# Patient Record
Sex: Male | Born: 1950 | Race: White | Hispanic: No | Marital: Married | Smoking: Former smoker
Health system: Southern US, Community
[De-identification: ages and names within clinical notes are randomized; demographics above are authoritative.]

## PROBLEM LIST (undated history)

## (undated) DIAGNOSIS — D649 Anemia, unspecified: Secondary | ICD-10-CM

## (undated) DIAGNOSIS — K219 Gastro-esophageal reflux disease without esophagitis: Secondary | ICD-10-CM

## (undated) DIAGNOSIS — J189 Pneumonia, unspecified organism: Secondary | ICD-10-CM

## (undated) DIAGNOSIS — E291 Testicular hypofunction: Secondary | ICD-10-CM

## (undated) DIAGNOSIS — E7800 Pure hypercholesterolemia, unspecified: Secondary | ICD-10-CM

## (undated) DIAGNOSIS — A048 Other specified bacterial intestinal infections: Secondary | ICD-10-CM

## (undated) DIAGNOSIS — IMO0002 Reserved for concepts with insufficient information to code with codable children: Secondary | ICD-10-CM

## (undated) DIAGNOSIS — N433 Hydrocele, unspecified: Secondary | ICD-10-CM

## (undated) DIAGNOSIS — R42 Dizziness and giddiness: Secondary | ICD-10-CM

## (undated) DIAGNOSIS — R300 Dysuria: Secondary | ICD-10-CM

## (undated) DIAGNOSIS — I10 Essential (primary) hypertension: Secondary | ICD-10-CM

## (undated) DIAGNOSIS — M1990 Unspecified osteoarthritis, unspecified site: Secondary | ICD-10-CM

## (undated) HISTORY — PX: UPPER GASTROINTESTINAL ENDOSCOPY: SHX188

## (undated) HISTORY — DX: Testicular hypofunction: E29.1

## (undated) HISTORY — PX: COLONOSCOPY, ESOPHAGOGASTRODUODENOSCOPY (EGD) AND ESOPHAGEAL DILATION: SHX5781

## (undated) HISTORY — PX: JOINT REPLACEMENT: SHX530

## (undated) HISTORY — DX: Essential (primary) hypertension: I10

## (undated) HISTORY — PX: CATARACT EXTRACTION W/ INTRAOCULAR LENS  IMPLANT, BILATERAL: SHX1307

## (undated) HISTORY — DX: Anemia, unspecified: D64.9

## (undated) HISTORY — PX: COLONOSCOPY: SHX174

## (undated) HISTORY — DX: Gastro-esophageal reflux disease without esophagitis: K21.9

## (undated) HISTORY — DX: Hydrocele, unspecified: N43.3

## (undated) HISTORY — DX: Dysuria: R30.0

## (undated) HISTORY — DX: Other specified bacterial intestinal infections: A04.8

## (undated) HISTORY — PX: EYE SURGERY: SHX253

## (undated) HISTORY — PX: KNEE CARTILAGE SURGERY: SHX688

---

## 2000-01-18 ENCOUNTER — Ambulatory Visit (HOSPITAL_BASED_OUTPATIENT_CLINIC_OR_DEPARTMENT_OTHER): Admission: RE | Admit: 2000-01-18 | Discharge: 2000-01-18 | Payer: Self-pay | Admitting: Orthopedic Surgery

## 2000-05-02 ENCOUNTER — Ambulatory Visit (HOSPITAL_COMMUNITY): Admission: RE | Admit: 2000-05-02 | Discharge: 2000-05-02 | Payer: Self-pay | Admitting: Orthopedic Surgery

## 2000-05-02 ENCOUNTER — Encounter: Payer: Self-pay | Admitting: Orthopedic Surgery

## 2016-07-27 ENCOUNTER — Other Ambulatory Visit: Payer: Self-pay | Admitting: Surgical

## 2016-07-27 DIAGNOSIS — M25511 Pain in right shoulder: Secondary | ICD-10-CM

## 2016-08-02 ENCOUNTER — Ambulatory Visit
Admission: RE | Admit: 2016-08-02 | Discharge: 2016-08-02 | Disposition: A | Discharge status: Home / Self Care | Payer: Worker's Compensation | Source: Ambulatory Visit | Attending: Surgical | Admitting: Surgical

## 2016-08-02 DIAGNOSIS — M25511 Pain in right shoulder: Secondary | ICD-10-CM

## 2017-01-17 DIAGNOSIS — J40 Bronchitis, not specified as acute or chronic: Secondary | ICD-10-CM | POA: Diagnosis not present

## 2017-01-17 DIAGNOSIS — I10 Essential (primary) hypertension: Secondary | ICD-10-CM | POA: Diagnosis not present

## 2017-01-17 DIAGNOSIS — R7303 Prediabetes: Secondary | ICD-10-CM | POA: Diagnosis not present

## 2017-01-29 DIAGNOSIS — H903 Sensorineural hearing loss, bilateral: Secondary | ICD-10-CM | POA: Diagnosis not present

## 2017-01-29 DIAGNOSIS — Z570 Occupational exposure to noise: Secondary | ICD-10-CM | POA: Diagnosis not present

## 2017-01-29 DIAGNOSIS — H9313 Tinnitus, bilateral: Secondary | ICD-10-CM | POA: Diagnosis not present

## 2017-01-29 DIAGNOSIS — R42 Dizziness and giddiness: Secondary | ICD-10-CM | POA: Diagnosis not present

## 2017-01-29 DIAGNOSIS — J342 Deviated nasal septum: Secondary | ICD-10-CM | POA: Diagnosis not present

## 2017-01-29 DIAGNOSIS — H8110 Benign paroxysmal vertigo, unspecified ear: Secondary | ICD-10-CM | POA: Diagnosis not present

## 2017-02-02 DIAGNOSIS — R42 Dizziness and giddiness: Secondary | ICD-10-CM | POA: Diagnosis not present

## 2017-02-02 DIAGNOSIS — H8110 Benign paroxysmal vertigo, unspecified ear: Secondary | ICD-10-CM | POA: Diagnosis not present

## 2017-02-02 DIAGNOSIS — R2689 Other abnormalities of gait and mobility: Secondary | ICD-10-CM | POA: Diagnosis not present

## 2017-02-05 DIAGNOSIS — Z6831 Body mass index (BMI) 31.0-31.9, adult: Secondary | ICD-10-CM | POA: Diagnosis not present

## 2017-02-05 DIAGNOSIS — E669 Obesity, unspecified: Secondary | ICD-10-CM | POA: Diagnosis not present

## 2017-02-05 DIAGNOSIS — E291 Testicular hypofunction: Secondary | ICD-10-CM | POA: Diagnosis not present

## 2017-02-05 DIAGNOSIS — N529 Male erectile dysfunction, unspecified: Secondary | ICD-10-CM | POA: Diagnosis not present

## 2017-02-05 DIAGNOSIS — D649 Anemia, unspecified: Secondary | ICD-10-CM | POA: Diagnosis not present

## 2017-02-05 DIAGNOSIS — Z0000 Encounter for general adult medical examination without abnormal findings: Secondary | ICD-10-CM | POA: Diagnosis not present

## 2017-02-05 DIAGNOSIS — I10 Essential (primary) hypertension: Secondary | ICD-10-CM | POA: Diagnosis not present

## 2017-02-05 DIAGNOSIS — Z125 Encounter for screening for malignant neoplasm of prostate: Secondary | ICD-10-CM | POA: Diagnosis not present

## 2017-02-05 DIAGNOSIS — R7303 Prediabetes: Secondary | ICD-10-CM | POA: Diagnosis not present

## 2017-02-05 DIAGNOSIS — Z79899 Other long term (current) drug therapy: Secondary | ICD-10-CM | POA: Diagnosis not present

## 2017-02-05 DIAGNOSIS — E782 Mixed hyperlipidemia: Secondary | ICD-10-CM | POA: Diagnosis not present

## 2017-02-06 DIAGNOSIS — H8110 Benign paroxysmal vertigo, unspecified ear: Secondary | ICD-10-CM | POA: Diagnosis not present

## 2017-02-06 DIAGNOSIS — R2689 Other abnormalities of gait and mobility: Secondary | ICD-10-CM | POA: Diagnosis not present

## 2017-02-06 DIAGNOSIS — R42 Dizziness and giddiness: Secondary | ICD-10-CM | POA: Diagnosis not present

## 2017-02-08 DIAGNOSIS — D4989 Neoplasm of unspecified behavior of other specified sites: Secondary | ICD-10-CM | POA: Diagnosis not present

## 2017-02-12 DIAGNOSIS — L988 Other specified disorders of the skin and subcutaneous tissue: Secondary | ICD-10-CM | POA: Diagnosis not present

## 2017-02-12 DIAGNOSIS — Z85828 Personal history of other malignant neoplasm of skin: Secondary | ICD-10-CM | POA: Diagnosis not present

## 2017-02-12 DIAGNOSIS — L821 Other seborrheic keratosis: Secondary | ICD-10-CM | POA: Diagnosis not present

## 2017-02-12 DIAGNOSIS — L570 Actinic keratosis: Secondary | ICD-10-CM | POA: Diagnosis not present

## 2017-02-12 DIAGNOSIS — L720 Epidermal cyst: Secondary | ICD-10-CM | POA: Diagnosis not present

## 2017-02-12 DIAGNOSIS — D225 Melanocytic nevi of trunk: Secondary | ICD-10-CM | POA: Diagnosis not present

## 2017-02-12 DIAGNOSIS — D485 Neoplasm of uncertain behavior of skin: Secondary | ICD-10-CM | POA: Diagnosis not present

## 2017-02-12 DIAGNOSIS — L853 Xerosis cutis: Secondary | ICD-10-CM | POA: Diagnosis not present

## 2017-02-12 DIAGNOSIS — D1801 Hemangioma of skin and subcutaneous tissue: Secondary | ICD-10-CM | POA: Diagnosis not present

## 2017-02-12 DIAGNOSIS — L814 Other melanin hyperpigmentation: Secondary | ICD-10-CM | POA: Diagnosis not present

## 2017-02-14 DIAGNOSIS — R2689 Other abnormalities of gait and mobility: Secondary | ICD-10-CM | POA: Diagnosis not present

## 2017-02-14 DIAGNOSIS — R42 Dizziness and giddiness: Secondary | ICD-10-CM | POA: Diagnosis not present

## 2017-02-14 DIAGNOSIS — H8110 Benign paroxysmal vertigo, unspecified ear: Secondary | ICD-10-CM | POA: Diagnosis not present

## 2017-03-08 DIAGNOSIS — D509 Iron deficiency anemia, unspecified: Secondary | ICD-10-CM | POA: Diagnosis not present

## 2017-03-21 DIAGNOSIS — K2950 Unspecified chronic gastritis without bleeding: Secondary | ICD-10-CM | POA: Diagnosis not present

## 2017-03-21 DIAGNOSIS — Z79899 Other long term (current) drug therapy: Secondary | ICD-10-CM | POA: Diagnosis not present

## 2017-03-21 DIAGNOSIS — D510 Vitamin B12 deficiency anemia due to intrinsic factor deficiency: Secondary | ICD-10-CM | POA: Diagnosis not present

## 2017-03-21 DIAGNOSIS — B9681 Helicobacter pylori [H. pylori] as the cause of diseases classified elsewhere: Secondary | ICD-10-CM | POA: Diagnosis not present

## 2017-03-21 DIAGNOSIS — E785 Hyperlipidemia, unspecified: Secondary | ICD-10-CM | POA: Diagnosis not present

## 2017-03-21 DIAGNOSIS — D509 Iron deficiency anemia, unspecified: Secondary | ICD-10-CM | POA: Diagnosis not present

## 2017-03-21 DIAGNOSIS — K2900 Acute gastritis without bleeding: Secondary | ICD-10-CM | POA: Diagnosis not present

## 2017-03-21 DIAGNOSIS — Z87891 Personal history of nicotine dependence: Secondary | ICD-10-CM | POA: Diagnosis not present

## 2017-03-21 HISTORY — PX: ESOPHAGOGASTRODUODENOSCOPY: SHX1529

## 2017-03-22 DIAGNOSIS — E669 Obesity, unspecified: Secondary | ICD-10-CM | POA: Diagnosis not present

## 2017-03-22 DIAGNOSIS — Z79899 Other long term (current) drug therapy: Secondary | ICD-10-CM | POA: Diagnosis not present

## 2017-03-22 DIAGNOSIS — I10 Essential (primary) hypertension: Secondary | ICD-10-CM | POA: Diagnosis not present

## 2017-03-22 DIAGNOSIS — M150 Primary generalized (osteo)arthritis: Secondary | ICD-10-CM | POA: Diagnosis not present

## 2017-03-22 DIAGNOSIS — D649 Anemia, unspecified: Secondary | ICD-10-CM | POA: Diagnosis not present

## 2017-06-22 DIAGNOSIS — D4989 Neoplasm of unspecified behavior of other specified sites: Secondary | ICD-10-CM | POA: Diagnosis not present

## 2017-07-16 DIAGNOSIS — H918X2 Other specified hearing loss, left ear: Secondary | ICD-10-CM | POA: Diagnosis not present

## 2017-07-16 DIAGNOSIS — H903 Sensorineural hearing loss, bilateral: Secondary | ICD-10-CM | POA: Diagnosis not present

## 2017-07-16 DIAGNOSIS — H9122 Sudden idiopathic hearing loss, left ear: Secondary | ICD-10-CM | POA: Diagnosis not present

## 2017-07-16 DIAGNOSIS — I10 Essential (primary) hypertension: Secondary | ICD-10-CM | POA: Diagnosis not present

## 2017-07-17 DIAGNOSIS — H918X2 Other specified hearing loss, left ear: Secondary | ICD-10-CM | POA: Diagnosis not present

## 2017-07-23 DIAGNOSIS — H9312 Tinnitus, left ear: Secondary | ICD-10-CM | POA: Diagnosis not present

## 2017-07-23 DIAGNOSIS — H918X2 Other specified hearing loss, left ear: Secondary | ICD-10-CM | POA: Diagnosis not present

## 2017-07-30 DIAGNOSIS — H903 Sensorineural hearing loss, bilateral: Secondary | ICD-10-CM | POA: Diagnosis not present

## 2017-07-30 DIAGNOSIS — F419 Anxiety disorder, unspecified: Secondary | ICD-10-CM | POA: Diagnosis not present

## 2017-07-30 DIAGNOSIS — H918X2 Other specified hearing loss, left ear: Secondary | ICD-10-CM | POA: Diagnosis not present

## 2017-07-31 DIAGNOSIS — L821 Other seborrheic keratosis: Secondary | ICD-10-CM | POA: Diagnosis not present

## 2017-07-31 DIAGNOSIS — L570 Actinic keratosis: Secondary | ICD-10-CM | POA: Diagnosis not present

## 2017-07-31 DIAGNOSIS — L910 Hypertrophic scar: Secondary | ICD-10-CM | POA: Diagnosis not present

## 2017-07-31 DIAGNOSIS — L720 Epidermal cyst: Secondary | ICD-10-CM | POA: Diagnosis not present

## 2017-07-31 DIAGNOSIS — D485 Neoplasm of uncertain behavior of skin: Secondary | ICD-10-CM | POA: Diagnosis not present

## 2017-07-31 DIAGNOSIS — L820 Inflamed seborrheic keratosis: Secondary | ICD-10-CM | POA: Diagnosis not present

## 2017-07-31 DIAGNOSIS — L814 Other melanin hyperpigmentation: Secondary | ICD-10-CM | POA: Diagnosis not present

## 2017-08-21 DIAGNOSIS — L720 Epidermal cyst: Secondary | ICD-10-CM | POA: Diagnosis not present

## 2017-08-21 DIAGNOSIS — L820 Inflamed seborrheic keratosis: Secondary | ICD-10-CM | POA: Diagnosis not present

## 2017-08-22 DIAGNOSIS — Z23 Encounter for immunization: Secondary | ICD-10-CM | POA: Diagnosis not present

## 2017-08-22 DIAGNOSIS — E782 Mixed hyperlipidemia: Secondary | ICD-10-CM | POA: Diagnosis not present

## 2017-08-22 DIAGNOSIS — E669 Obesity, unspecified: Secondary | ICD-10-CM | POA: Diagnosis not present

## 2017-08-22 DIAGNOSIS — D649 Anemia, unspecified: Secondary | ICD-10-CM | POA: Diagnosis not present

## 2017-08-22 DIAGNOSIS — M150 Primary generalized (osteo)arthritis: Secondary | ICD-10-CM | POA: Diagnosis not present

## 2017-08-22 DIAGNOSIS — I10 Essential (primary) hypertension: Secondary | ICD-10-CM | POA: Diagnosis not present

## 2017-08-22 DIAGNOSIS — R7303 Prediabetes: Secondary | ICD-10-CM | POA: Diagnosis not present

## 2017-08-31 DIAGNOSIS — D485 Neoplasm of uncertain behavior of skin: Secondary | ICD-10-CM | POA: Diagnosis not present

## 2017-08-31 DIAGNOSIS — L814 Other melanin hyperpigmentation: Secondary | ICD-10-CM | POA: Diagnosis not present

## 2017-09-04 DIAGNOSIS — H8120 Vestibular neuronitis, unspecified ear: Secondary | ICD-10-CM | POA: Diagnosis not present

## 2017-09-04 DIAGNOSIS — H9042 Sensorineural hearing loss, unilateral, left ear, with unrestricted hearing on the contralateral side: Secondary | ICD-10-CM | POA: Diagnosis not present

## 2017-09-24 DIAGNOSIS — H9312 Tinnitus, left ear: Secondary | ICD-10-CM | POA: Diagnosis not present

## 2017-09-24 DIAGNOSIS — H918X2 Other specified hearing loss, left ear: Secondary | ICD-10-CM | POA: Diagnosis not present

## 2017-09-24 DIAGNOSIS — H8102 Meniere's disease, left ear: Secondary | ICD-10-CM | POA: Diagnosis not present

## 2017-09-24 DIAGNOSIS — R42 Dizziness and giddiness: Secondary | ICD-10-CM | POA: Diagnosis not present

## 2017-11-05 DIAGNOSIS — D4989 Neoplasm of unspecified behavior of other specified sites: Secondary | ICD-10-CM | POA: Diagnosis not present

## 2018-01-29 DIAGNOSIS — D225 Melanocytic nevi of trunk: Secondary | ICD-10-CM | POA: Diagnosis not present

## 2018-01-29 DIAGNOSIS — L570 Actinic keratosis: Secondary | ICD-10-CM | POA: Diagnosis not present

## 2018-01-29 DIAGNOSIS — L814 Other melanin hyperpigmentation: Secondary | ICD-10-CM | POA: Diagnosis not present

## 2018-01-29 DIAGNOSIS — L821 Other seborrheic keratosis: Secondary | ICD-10-CM | POA: Diagnosis not present

## 2018-02-11 DIAGNOSIS — E291 Testicular hypofunction: Secondary | ICD-10-CM | POA: Diagnosis not present

## 2018-02-11 DIAGNOSIS — H8102 Meniere's disease, left ear: Secondary | ICD-10-CM | POA: Diagnosis not present

## 2018-02-11 DIAGNOSIS — I10 Essential (primary) hypertension: Secondary | ICD-10-CM | POA: Diagnosis not present

## 2018-02-11 DIAGNOSIS — Z125 Encounter for screening for malignant neoplasm of prostate: Secondary | ICD-10-CM | POA: Diagnosis not present

## 2018-02-11 DIAGNOSIS — C4490 Unspecified malignant neoplasm of skin, unspecified: Secondary | ICD-10-CM | POA: Diagnosis not present

## 2018-02-11 DIAGNOSIS — M150 Primary generalized (osteo)arthritis: Secondary | ICD-10-CM | POA: Diagnosis not present

## 2018-02-11 DIAGNOSIS — Z0000 Encounter for general adult medical examination without abnormal findings: Secondary | ICD-10-CM | POA: Diagnosis not present

## 2018-02-11 DIAGNOSIS — D649 Anemia, unspecified: Secondary | ICD-10-CM | POA: Diagnosis not present

## 2018-02-11 DIAGNOSIS — E782 Mixed hyperlipidemia: Secondary | ICD-10-CM | POA: Diagnosis not present

## 2018-02-11 DIAGNOSIS — R7303 Prediabetes: Secondary | ICD-10-CM | POA: Diagnosis not present

## 2018-03-05 DIAGNOSIS — D4989 Neoplasm of unspecified behavior of other specified sites: Secondary | ICD-10-CM | POA: Diagnosis not present

## 2018-04-12 DIAGNOSIS — M79672 Pain in left foot: Secondary | ICD-10-CM | POA: Diagnosis not present

## 2018-04-12 DIAGNOSIS — B351 Tinea unguium: Secondary | ICD-10-CM | POA: Diagnosis not present

## 2018-04-17 DIAGNOSIS — B351 Tinea unguium: Secondary | ICD-10-CM | POA: Diagnosis not present

## 2018-04-17 DIAGNOSIS — M779 Enthesopathy, unspecified: Secondary | ICD-10-CM | POA: Diagnosis not present

## 2018-05-20 ENCOUNTER — Other Ambulatory Visit: Payer: Self-pay | Admitting: Orthopedic Surgery

## 2018-05-23 DIAGNOSIS — H209 Unspecified iridocyclitis: Secondary | ICD-10-CM | POA: Diagnosis not present

## 2018-05-23 DIAGNOSIS — S058X1A Other injuries of right eye and orbit, initial encounter: Secondary | ICD-10-CM | POA: Diagnosis not present

## 2018-05-23 DIAGNOSIS — H1131 Conjunctival hemorrhage, right eye: Secondary | ICD-10-CM | POA: Diagnosis not present

## 2018-05-24 DIAGNOSIS — H2000 Unspecified acute and subacute iridocyclitis: Secondary | ICD-10-CM | POA: Diagnosis not present

## 2018-05-24 DIAGNOSIS — H2011 Chronic iridocyclitis, right eye: Secondary | ICD-10-CM | POA: Diagnosis not present

## 2018-06-04 ENCOUNTER — Other Ambulatory Visit: Payer: Self-pay | Admitting: Orthopedic Surgery

## 2018-06-04 DIAGNOSIS — M19011 Primary osteoarthritis, right shoulder: Secondary | ICD-10-CM

## 2018-06-18 NOTE — Pre-Procedure Instructions (Signed)
Jesus Mata  06/18/2018      [id number] - [place], [place] [address] [place] [address] Phone: [phone] Fax: [phone]    Your procedure is scheduled on July 18  Report to [facility] Admitting at 0800 A.M.  Call this number if you have problems the morning of surgery:  [phone]   Remember:  Do not eat or drink after midnight.      Take these medicines the morning of surgery with A SIP OF WATER NONE  7 days prior to surgery STOP taking any Aspirin(unless otherwise instructed by your surgeon), Aleve, Naproxen, Ibuprofen, Motrin, Advil, Goody's, BC's, all herbal medications, fish oil, and all vitamins   Do not wear jewelry  Do not wear lotions, powders, or cologne, or deodorant.  Men may shave face and neck.  Do not bring valuables to the hospital.  [facility] is not responsible for any belongings or valuables.  Contacts, dentures or bridgework may not be worn into surgery.  Leave your suitcase in the car.  After surgery it may be brought to your room.  For patients admitted to the hospital, discharge time will be determined by your treatment team.  Patients discharged the day of surgery will not be allowed to drive home.    Special instructions:   [place]- Preparing For Surgery  Before surgery, you can play an important role. Because skin is not sterile, your skin needs to be as free of germs as possible. You can reduce the number of germs on your skin by washing with CHG (chlorahexidine gluconate) Soap before surgery.  CHG is an antiseptic cleaner which kills germs and bonds with the skin to continue killing germs even after washing.    Oral Hygiene is also important to reduce your risk of infection.  Remember - BRUSH YOUR TEETH THE MORNING OF SURGERY WITH YOUR REGULAR TOOTHPASTE  Please do not use if you have an allergy to CHG or antibacterial soaps. If your  skin becomes reddened/irritated stop using the CHG.  Do not shave (including legs and underarms) for at least 48 hours prior to first CHG shower. It is OK to shave your face.  Please follow these instructions carefully.   1. Shower the NIGHT BEFORE SURGERY and the MORNING OF SURGERY with CHG.   2. If you chose to wash your hair, wash your hair first as usual with your normal shampoo.  3. After you shampoo, rinse your hair and body thoroughly to remove the shampoo.  4. Use CHG as you would any other liquid soap. You can apply CHG directly to the skin and wash gently with a scrungie or a clean washcloth.   5. Apply the CHG Soap to your body ONLY FROM THE NECK DOWN.  Do not use on open wounds or open sores. Avoid contact with your eyes, ears, mouth and genitals (private parts). Wash Face and genitals (private parts)  with your normal soap.  6. Wash thoroughly, paying special attention to the area where your surgery will be performed.  7. Thoroughly rinse your body with warm water from the neck down.  8. DO NOT shower/wash with your normal soap after using and rinsing off the CHG Soap.  9. Pat yourself dry with a CLEAN TOWEL.  10. Wear CLEAN PAJAMAS to bed the night before surgery, wear comfortable clothes the morning of surgery  11. Place  CLEAN SHEETS on your bed the night of your first shower and DO NOT SLEEP WITH PETS.    Day of Surgery:  Do not apply any deodorants/lotions.  Please wear clean clothes to the hospital/surgery center.   Remember to brush your teeth WITH YOUR REGULAR TOOTHPASTE.    Please read over the following fact sheets that you were given.

## 2018-06-19 ENCOUNTER — Encounter (HOSPITAL_COMMUNITY)
Admission: RE | Admit: 2018-06-19 | Discharge: 2018-06-19 | Disposition: A | Discharge status: Home / Self Care | Payer: Worker's Compensation | Source: Ambulatory Visit | Attending: Orthopedic Surgery | Admitting: Orthopedic Surgery

## 2018-06-19 ENCOUNTER — Ambulatory Visit (HOSPITAL_COMMUNITY)
Admission: RE | Admit: 2018-06-19 | Discharge: 2018-06-19 | Disposition: A | Discharge status: Home / Self Care | Payer: Worker's Compensation | Source: Ambulatory Visit | Attending: Orthopedic Surgery | Admitting: Orthopedic Surgery

## 2018-06-19 ENCOUNTER — Other Ambulatory Visit: Payer: Self-pay

## 2018-06-19 ENCOUNTER — Encounter (HOSPITAL_COMMUNITY): Payer: Self-pay

## 2018-06-19 DIAGNOSIS — Z01810 Encounter for preprocedural cardiovascular examination: Secondary | ICD-10-CM | POA: Insufficient documentation

## 2018-06-19 DIAGNOSIS — Z01818 Encounter for other preprocedural examination: Secondary | ICD-10-CM | POA: Insufficient documentation

## 2018-06-19 DIAGNOSIS — I700 Atherosclerosis of aorta: Secondary | ICD-10-CM | POA: Diagnosis not present

## 2018-06-19 DIAGNOSIS — Z01812 Encounter for preprocedural laboratory examination: Secondary | ICD-10-CM | POA: Diagnosis not present

## 2018-06-19 HISTORY — DX: Dizziness and giddiness: R42

## 2018-06-19 HISTORY — DX: Pure hypercholesterolemia, unspecified: E78.00

## 2018-06-19 LAB — COMPREHENSIVE METABOLIC PANEL
ALT: 20 U/L (ref 0–44)
ANION GAP: 10 (ref 5–15)
AST: 28 U/L (ref 15–41)
Albumin: 3.8 g/dL (ref 3.5–5.0)
Alkaline Phosphatase: 50 U/L (ref 38–126)
BILIRUBIN TOTAL: 0.9 mg/dL (ref 0.3–1.2)
BUN: 17 mg/dL (ref 8–23)
CHLORIDE: 103 mmol/L (ref 98–111)
CO2: 25 mmol/L (ref 22–32)
Calcium: 9.1 mg/dL (ref 8.9–10.3)
Creatinine, Ser: 1.22 mg/dL (ref 0.61–1.24)
GFR, EST NON AFRICAN AMERICAN: 60 mL/min — AB (ref >60)
Glucose, Bld: 119 mg/dL — ABNORMAL HIGH (ref 70–99)
POTASSIUM: 3.4 mmol/L — AB (ref 3.5–5.1)
Sodium: 138 mmol/L (ref 135–145)
TOTAL PROTEIN: 7.1 g/dL (ref 6.5–8.1)

## 2018-06-19 LAB — URINALYSIS, ROUTINE W REFLEX MICROSCOPIC
Bilirubin Urine: NEGATIVE
Glucose, UA: NEGATIVE mg/dL
HGB URINE DIPSTICK: NEGATIVE
Ketones, ur: NEGATIVE mg/dL
Leukocytes, UA: NEGATIVE
Nitrite: NEGATIVE
PROTEIN: NEGATIVE mg/dL
Specific Gravity, Urine: 1.013 (ref 1.005–1.030)
pH: 6 (ref 5.0–8.0)

## 2018-06-19 LAB — SURGICAL PCR SCREEN
MRSA, PCR: NEGATIVE
Staphylococcus aureus: NEGATIVE

## 2018-06-19 LAB — CBC WITH DIFFERENTIAL/PLATELET
Abs Immature Granulocytes: 0 10*3/uL (ref 0.0–0.1)
BASOS PCT: 1 %
Basophils Absolute: 0.1 10*3/uL (ref 0.0–0.1)
EOS ABS: 0.3 10*3/uL (ref 0.0–0.7)
EOS PCT: 5 %
HCT: 40.7 % (ref 39.0–52.0)
Hemoglobin: 13.9 g/dL (ref 13.0–17.0)
Immature Granulocytes: 0 %
LYMPHS ABS: 1.7 10*3/uL (ref 0.7–4.0)
Lymphocytes Relative: 27 %
MCH: 31 pg (ref 26.0–34.0)
MCHC: 34.2 g/dL (ref 30.0–36.0)
MCV: 90.6 fL (ref 78.0–100.0)
MONOS PCT: 11 %
Monocytes Absolute: 0.7 10*3/uL (ref 0.1–1.0)
NEUTROS PCT: 56 %
Neutro Abs: 3.5 10*3/uL (ref 1.7–7.7)
PLATELETS: 241 10*3/uL (ref 150–400)
RBC: 4.49 MIL/uL (ref 4.22–5.81)
RDW: 13.2 % (ref 11.5–15.5)
WBC: 6.4 10*3/uL (ref 4.0–10.5)

## 2018-06-19 LAB — TYPE AND SCREEN
ABO/RH(D): A NEG
ANTIBODY SCREEN: NEGATIVE

## 2018-06-19 LAB — APTT: aPTT: 25 seconds (ref 24–36)

## 2018-06-19 LAB — ABO/RH: ABO/RH(D): A NEG

## 2018-06-19 LAB — PROTIME-INR
INR: 0.98
PROTHROMBIN TIME: 12.9 s (ref 11.4–15.2)

## 2018-06-19 NOTE — Progress Notes (Signed)
PCP - Gilford Rile Cardiologist - denies  Chest x-ray - 06/19/18 EKG - 06/19/18 Stress Test - > 10 years ago ECHO - denies Cardiac Cath - denies    Anesthesia review: No  Patient denies shortness of breath, fever, cough and chest pain at PAT appointment   Patient verbalized understanding of instructions that were given to them at the PAT appointment. Patient was also instructed that they will need to review over the PAT instructions again at home before surgery.

## 2018-06-21 ENCOUNTER — Ambulatory Visit
Admission: RE | Admit: 2018-06-21 | Discharge: 2018-06-21 | Disposition: A | Discharge status: Home / Self Care | Payer: Worker's Compensation | Source: Ambulatory Visit | Attending: Orthopedic Surgery | Admitting: Orthopedic Surgery

## 2018-06-21 DIAGNOSIS — M19011 Primary osteoarthritis, right shoulder: Secondary | ICD-10-CM

## 2018-06-26 MED ORDER — TRANEXAMIC ACID 1000 MG/10ML IV SOLN
1000.0000 mg | INTRAVENOUS | Status: AC
  Administered 2018-06-27: 1000 mg via INTRAVENOUS
  Filled 2018-06-26: qty 1100

## 2018-06-27 ENCOUNTER — Encounter (HOSPITAL_COMMUNITY): Payer: Self-pay | Admitting: Anesthesiology

## 2018-06-27 ENCOUNTER — Encounter (HOSPITAL_COMMUNITY)
Admission: RE | Disposition: A | Discharge status: Home / Self Care | Payer: Self-pay | Source: Ambulatory Visit | Attending: Orthopedic Surgery

## 2018-06-27 ENCOUNTER — Inpatient Hospital Stay (HOSPITAL_COMMUNITY)
Admission: RE | Admit: 2018-06-27 | Discharge: 2018-06-28 | DRG: 483 | Disposition: A | Discharge status: Home / Self Care | Payer: Worker's Compensation | Source: Ambulatory Visit | Attending: Orthopedic Surgery | Admitting: Orthopedic Surgery

## 2018-06-27 ENCOUNTER — Inpatient Hospital Stay (HOSPITAL_COMMUNITY): Payer: Worker's Compensation | Admitting: Anesthesiology

## 2018-06-27 ENCOUNTER — Inpatient Hospital Stay (HOSPITAL_COMMUNITY): Payer: Worker's Compensation

## 2018-06-27 ENCOUNTER — Other Ambulatory Visit: Payer: Self-pay

## 2018-06-27 DIAGNOSIS — Z96611 Presence of right artificial shoulder joint: Secondary | ICD-10-CM

## 2018-06-27 DIAGNOSIS — E7800 Pure hypercholesterolemia, unspecified: Secondary | ICD-10-CM | POA: Diagnosis present

## 2018-06-27 DIAGNOSIS — Z85840 Personal history of malignant neoplasm of eye: Secondary | ICD-10-CM | POA: Diagnosis not present

## 2018-06-27 DIAGNOSIS — Z87891 Personal history of nicotine dependence: Secondary | ICD-10-CM

## 2018-06-27 DIAGNOSIS — M19011 Primary osteoarthritis, right shoulder: Principal | ICD-10-CM | POA: Diagnosis present

## 2018-06-27 DIAGNOSIS — Z79899 Other long term (current) drug therapy: Secondary | ICD-10-CM

## 2018-06-27 HISTORY — DX: Unspecified osteoarthritis, unspecified site: M19.90

## 2018-06-27 HISTORY — DX: Reserved for concepts with insufficient information to code with codable children: IMO0002

## 2018-06-27 HISTORY — PX: TOTAL SHOULDER ARTHROPLASTY: SHX126

## 2018-06-27 SURGERY — ARTHROPLASTY, SHOULDER, TOTAL
Anesthesia: General | Site: Shoulder | Laterality: Right

## 2018-06-27 MED ORDER — OXYCODONE HCL 5 MG PO TABS
10.0000 mg | ORAL_TABLET | ORAL | Status: DC | PRN
  Administered 2018-06-27: 15 mg via ORAL
  Administered 2018-06-28: 10 mg via ORAL
  Filled 2018-06-27 (×2): qty 3

## 2018-06-27 MED ORDER — PROPOFOL 10 MG/ML IV BOLUS
INTRAVENOUS | Status: DC | PRN
  Administered 2018-06-27: 180 mg via INTRAVENOUS

## 2018-06-27 MED ORDER — DEXAMETHASONE SODIUM PHOSPHATE 10 MG/ML IJ SOLN
INTRAMUSCULAR | Status: DC | PRN
  Administered 2018-06-27: 10 mg via INTRAVENOUS

## 2018-06-27 MED ORDER — ASPIRIN EC 81 MG PO TBEC
81.0000 mg | DELAYED_RELEASE_TABLET | Freq: Two times a day (BID) | ORAL | Status: DC
  Administered 2018-06-27 – 2018-06-28 (×2): 81 mg via ORAL
  Filled 2018-06-27 (×3): qty 1

## 2018-06-27 MED ORDER — LACTATED RINGERS IV SOLN: INTRAVENOUS | Status: DC

## 2018-06-27 MED ORDER — BUPIVACAINE LIPOSOME 1.3 % IJ SUSP
INTRAMUSCULAR | Status: DC | PRN
  Administered 2018-06-27: 10 mL via PERINEURAL

## 2018-06-27 MED ORDER — FENTANYL CITRATE (PF) 100 MCG/2ML IJ SOLN
INTRAMUSCULAR | Status: AC
  Administered 2018-06-27: 50 ug via INTRAVENOUS
  Filled 2018-06-27: qty 2

## 2018-06-27 MED ORDER — MIDAZOLAM HCL 2 MG/2ML IJ SOLN
INTRAMUSCULAR | Status: AC
  Administered 2018-06-27: 2 mg via INTRAVENOUS
  Filled 2018-06-27: qty 2

## 2018-06-27 MED ORDER — OXYCODONE HCL 5 MG PO TABS
5.0000 mg | ORAL_TABLET | ORAL | Status: DC | PRN
  Administered 2018-06-27: 5 mg via ORAL
  Filled 2018-06-27: qty 1

## 2018-06-27 MED ORDER — FENTANYL CITRATE (PF) 100 MCG/2ML IJ SOLN
50.0000 ug | Freq: Once | INTRAMUSCULAR | Status: AC
  Administered 2018-06-27: 50 ug via INTRAVENOUS
  Filled 2018-06-27: qty 1

## 2018-06-27 MED ORDER — DOCUSATE SODIUM 100 MG PO CAPS
100.0000 mg | ORAL_CAPSULE | Freq: Two times a day (BID) | ORAL | Status: DC
  Administered 2018-06-27 – 2018-06-28 (×2): 100 mg via ORAL
  Filled 2018-06-27 (×3): qty 1

## 2018-06-27 MED ORDER — LIDOCAINE 2% (20 MG/ML) 5 ML SYRINGE
INTRAMUSCULAR | Status: DC | PRN
  Administered 2018-06-27: 80 mg via INTRAVENOUS

## 2018-06-27 MED ORDER — METHOCARBAMOL 500 MG PO TABS: 500.0000 mg | ORAL_TABLET | Freq: Three times a day (TID) | ORAL | 0 refills | Status: DC

## 2018-06-27 MED ORDER — LACTATED RINGERS IV SOLN
INTRAVENOUS | Status: DC
  Administered 2018-06-27: 09:00:00 via INTRAVENOUS

## 2018-06-27 MED ORDER — ZOLPIDEM TARTRATE 5 MG PO TABS: 5.0000 mg | ORAL_TABLET | Freq: Every evening | ORAL | Status: DC | PRN

## 2018-06-27 MED ORDER — CEFAZOLIN SODIUM-DEXTROSE 2-4 GM/100ML-% IV SOLN
INTRAVENOUS | Status: AC
  Filled 2018-06-27: qty 100

## 2018-06-27 MED ORDER — BISACODYL 5 MG PO TBEC: 5.0000 mg | DELAYED_RELEASE_TABLET | Freq: Every day | ORAL | Status: DC | PRN

## 2018-06-27 MED ORDER — POVIDONE-IODINE 7.5 % EX SOLN
Freq: Once | CUTANEOUS | Status: DC
  Filled 2018-06-27: qty 118

## 2018-06-27 MED ORDER — ACETAMINOPHEN 325 MG PO TABS: 325.0000 mg | ORAL_TABLET | Freq: Four times a day (QID) | ORAL | Status: DC | PRN

## 2018-06-27 MED ORDER — BUPIVACAINE HCL (PF) 0.5 % IJ SOLN
INTRAMUSCULAR | Status: DC | PRN
  Administered 2018-06-27: 15 mL via PERINEURAL

## 2018-06-27 MED ORDER — ALUMINUM HYDROXIDE GEL 320 MG/5ML PO SUSP
15.0000 mL | ORAL | Status: DC | PRN
  Filled 2018-06-27: qty 30

## 2018-06-27 MED ORDER — SODIUM CHLORIDE 0.9 % IV SOLN
INTRAVENOUS | Status: DC | PRN
  Administered 2018-06-27: 25 ug/min via INTRAVENOUS

## 2018-06-27 MED ORDER — HYDROMORPHONE HCL 1 MG/ML IJ SOLN
0.5000 mg | INTRAMUSCULAR | Status: DC | PRN
  Administered 2018-06-28: 1 mg via INTRAVENOUS
  Filled 2018-06-27: qty 1

## 2018-06-27 MED ORDER — ONDANSETRON HCL 4 MG PO TABS
4.0000 mg | ORAL_TABLET | Freq: Four times a day (QID) | ORAL | Status: DC | PRN
Start: 2018-06-27 — End: 2018-06-28

## 2018-06-27 MED ORDER — ROCURONIUM BROMIDE 10 MG/ML (PF) SYRINGE
PREFILLED_SYRINGE | INTRAVENOUS | Status: DC | PRN
  Administered 2018-06-27: 50 mg via INTRAVENOUS

## 2018-06-27 MED ORDER — PROPOFOL 10 MG/ML IV BOLUS
INTRAVENOUS | Status: AC
  Filled 2018-06-27: qty 20

## 2018-06-27 MED ORDER — HYDROCHLOROTHIAZIDE 25 MG PO TABS
50.0000 mg | ORAL_TABLET | Freq: Every day | ORAL | Status: DC
  Administered 2018-06-28: 50 mg via ORAL
  Filled 2018-06-27: qty 2
  Filled 2018-06-27: qty 1

## 2018-06-27 MED ORDER — ONDANSETRON HCL 4 MG/2ML IJ SOLN: 4.0000 mg | Freq: Four times a day (QID) | INTRAMUSCULAR | Status: DC | PRN

## 2018-06-27 MED ORDER — SODIUM CHLORIDE 0.9 % IR SOLN
Status: DC | PRN
  Administered 2018-06-27: 3000 mL

## 2018-06-27 MED ORDER — CEFAZOLIN SODIUM-DEXTROSE 2-4 GM/100ML-% IV SOLN
2.0000 g | INTRAVENOUS | Status: AC
  Administered 2018-06-27: 2 g via INTRAVENOUS

## 2018-06-27 MED ORDER — CEFAZOLIN SODIUM-DEXTROSE 2-4 GM/100ML-% IV SOLN
2.0000 g | Freq: Four times a day (QID) | INTRAVENOUS | Status: AC
  Administered 2018-06-27 – 2018-06-28 (×3): 2 g via INTRAVENOUS
  Filled 2018-06-27 (×3): qty 100

## 2018-06-27 MED ORDER — OXYCODONE-ACETAMINOPHEN 5-325 MG PO TABS: 1.0000 | ORAL_TABLET | ORAL | 0 refills | Status: DC | PRN

## 2018-06-27 MED ORDER — PHENOL 1.4 % MT LIQD: 1.0000 | OROMUCOSAL | Status: DC | PRN

## 2018-06-27 MED ORDER — SENNOSIDES-DOCUSATE SODIUM 8.6-50 MG PO TABS: 1.0000 | ORAL_TABLET | Freq: Every evening | ORAL | Status: DC | PRN

## 2018-06-27 MED ORDER — METOCLOPRAMIDE HCL 5 MG/ML IJ SOLN: 5.0000 mg | Freq: Three times a day (TID) | INTRAMUSCULAR | Status: DC | PRN

## 2018-06-27 MED ORDER — MIDAZOLAM HCL 2 MG/2ML IJ SOLN
2.0000 mg | Freq: Once | INTRAMUSCULAR | Status: AC
  Administered 2018-06-27: 2 mg via INTRAVENOUS
  Filled 2018-06-27: qty 2

## 2018-06-27 MED ORDER — ONDANSETRON HCL 4 MG/2ML IJ SOLN
INTRAMUSCULAR | Status: DC | PRN
  Administered 2018-06-27: 4 mg via INTRAVENOUS

## 2018-06-27 MED ORDER — ACETAMINOPHEN 500 MG PO TABS
1000.0000 mg | ORAL_TABLET | Freq: Four times a day (QID) | ORAL | Status: DC
  Administered 2018-06-27 – 2018-06-28 (×3): 1000 mg via ORAL
  Filled 2018-06-27 (×3): qty 2

## 2018-06-27 MED ORDER — DOCUSATE SODIUM 100 MG PO CAPS: 100.0000 mg | ORAL_CAPSULE | Freq: Three times a day (TID) | ORAL | 0 refills | Status: DC | PRN

## 2018-06-27 MED ORDER — SUGAMMADEX SODIUM 200 MG/2ML IV SOLN
INTRAVENOUS | Status: DC | PRN
  Administered 2018-06-27: 200 mg via INTRAVENOUS

## 2018-06-27 MED ORDER — 0.9 % SODIUM CHLORIDE (POUR BTL) OPTIME
TOPICAL | Status: DC | PRN
  Administered 2018-06-27: 1000 mL

## 2018-06-27 MED ORDER — MENTHOL 3 MG MT LOZG: 1.0000 | LOZENGE | OROMUCOSAL | Status: DC | PRN

## 2018-06-27 MED ORDER — METOCLOPRAMIDE HCL 5 MG PO TABS: 5.0000 mg | ORAL_TABLET | Freq: Three times a day (TID) | ORAL | Status: DC | PRN

## 2018-06-27 MED ORDER — MEPERIDINE HCL 50 MG/ML IJ SOLN: 6.2500 mg | INTRAMUSCULAR | Status: DC | PRN

## 2018-06-27 MED ORDER — ATORVASTATIN CALCIUM 10 MG PO TABS
10.0000 mg | ORAL_TABLET | ORAL | Status: DC
  Filled 2018-06-27: qty 1

## 2018-06-27 MED ORDER — HYDROMORPHONE HCL 1 MG/ML IJ SOLN: 0.2500 mg | INTRAMUSCULAR | Status: DC | PRN

## 2018-06-27 MED ORDER — PROMETHAZINE HCL 25 MG/ML IJ SOLN: 6.2500 mg | INTRAMUSCULAR | Status: DC | PRN

## 2018-06-27 MED ORDER — DIPHENHYDRAMINE HCL 12.5 MG/5ML PO ELIX: 12.5000 mg | ORAL_SOLUTION | ORAL | Status: DC | PRN

## 2018-06-27 MED ORDER — SODIUM CHLORIDE 0.9 % IV SOLN
INTRAVENOUS | Status: DC
  Administered 2018-06-28: via INTRAVENOUS

## 2018-06-27 MED ORDER — METHOCARBAMOL 1000 MG/10ML IJ SOLN: 500.0000 mg | Freq: Four times a day (QID) | INTRAVENOUS | Status: DC | PRN

## 2018-06-27 MED ORDER — METHOCARBAMOL 500 MG PO TABS
500.0000 mg | ORAL_TABLET | Freq: Four times a day (QID) | ORAL | Status: DC | PRN
  Administered 2018-06-27 – 2018-06-28 (×2): 500 mg via ORAL
  Filled 2018-06-27 (×2): qty 1

## 2018-06-27 SURGICAL SUPPLY — 80 items
AID PSTN UNV HD RSTRNT DISP (MISCELLANEOUS) ×1
BIT DRILL 5/64X5 DISP (BIT) ×3 IMPLANT
BLADE SAW SAG 73X25 THK (BLADE) ×2
BLADE SAW SGTL 73X25 THK (BLADE) ×1 IMPLANT
BLADE SURG 15 STRL LF DISP TIS (BLADE) ×1 IMPLANT
BLADE SURG 15 STRL SS (BLADE) ×3
CEMENT BONE DEPUY (Cement) ×2 IMPLANT
CHLORAPREP W/TINT 26ML (MISCELLANEOUS) ×5 IMPLANT
CLOSURE STERI-STRIP 1/2X4 (GAUZE/BANDAGES/DRESSINGS) ×1
CLOSURE WOUND 1/2 X4 (GAUZE/BANDAGES/DRESSINGS) ×1
CLSR STERI-STRIP ANTIMIC 1/2X4 (GAUZE/BANDAGES/DRESSINGS) ×1 IMPLANT
COVER SURGICAL LIGHT HANDLE (MISCELLANEOUS) ×3 IMPLANT
DRAPE INCISE IOBAN 66X45 STRL (DRAPES) ×3 IMPLANT
DRAPE ORTHO SPLIT 77X108 STRL (DRAPES) ×6
DRAPE SURG 17X23 STRL (DRAPES) ×3 IMPLANT
DRAPE SURG ORHT 6 SPLT 77X108 (DRAPES) ×2 IMPLANT
DRAPE U-SHAPE 47X51 STRL (DRAPES) ×3 IMPLANT
DRSG AQUACEL AG ADV 3.5X 6 (GAUZE/BANDAGES/DRESSINGS) ×2 IMPLANT
DRSG AQUACEL AG ADV 3.5X10 (GAUZE/BANDAGES/DRESSINGS) IMPLANT
ELECT BLADE 4.0 EZ CLEAN MEGAD (MISCELLANEOUS)
ELECT REM PT RETURN 9FT ADLT (ELECTROSURGICAL) ×3
ELECTRODE BLDE 4.0 EZ CLN MEGD (MISCELLANEOUS) IMPLANT
ELECTRODE REM PT RTRN 9FT ADLT (ELECTROSURGICAL) ×1 IMPLANT
GLENOID PEGGED CORTILOC M40 (Orthopedic Implant) IMPLANT
GLOVE BIO SURGEON STRL SZ7 (GLOVE) ×3 IMPLANT
GLOVE BIO SURGEON STRL SZ7.5 (GLOVE) ×3 IMPLANT
GLOVE BIOGEL PI IND STRL 6.5 (GLOVE) IMPLANT
GLOVE BIOGEL PI IND STRL 7.0 (GLOVE) ×1 IMPLANT
GLOVE BIOGEL PI IND STRL 8 (GLOVE) ×1 IMPLANT
GLOVE BIOGEL PI INDICATOR 6.5 (GLOVE) ×6
GLOVE BIOGEL PI INDICATOR 7.0 (GLOVE) ×2
GLOVE BIOGEL PI INDICATOR 8 (GLOVE) ×2
GOWN STRL REUS W/ TWL LRG LVL3 (GOWN DISPOSABLE) ×1 IMPLANT
GOWN STRL REUS W/ TWL XL LVL3 (GOWN DISPOSABLE) ×1 IMPLANT
GOWN STRL REUS W/TWL LRG LVL3 (GOWN DISPOSABLE) ×9
GOWN STRL REUS W/TWL XL LVL3 (GOWN DISPOSABLE) ×3
GUIDEWIRE GLENOID 2.5X220 (WIRE) ×2 IMPLANT
HANDPIECE INTERPULSE COAX TIP (DISPOSABLE) ×3
HEAD HUMERAL AEQUALIS 48X18 (Head) ×3 IMPLANT
HEAD HUMERAL HIGH OS 48X18 (Head) IMPLANT
HEMOSTAT SURGICEL 2X14 (HEMOSTASIS) ×3 IMPLANT
HOOD PEEL AWAY FLYTE STAYCOOL (MISCELLANEOUS) ×8 IMPLANT
HUMERAL STEM AEQUALIS 3X74 (Shoulder) ×3 IMPLANT
KIT BASIN OR (CUSTOM PROCEDURE TRAY) ×3 IMPLANT
KIT TURNOVER KIT B (KITS) ×3 IMPLANT
MANIFOLD NEPTUNE II (INSTRUMENTS) ×3 IMPLANT
NDL MAYO TROCAR (NEEDLE) ×1 IMPLANT
NEEDLE MAYO TROCAR (NEEDLE) ×3 IMPLANT
NS IRRIG 1000ML POUR BTL (IV SOLUTION) ×3 IMPLANT
PACK SHOULDER (CUSTOM PROCEDURE TRAY) ×3 IMPLANT
PAD ARMBOARD 7.5X6 YLW CONV (MISCELLANEOUS) ×4 IMPLANT
PEGGED GLENOID CORTILOC (Orthopedic Implant) ×2 IMPLANT
RESTRAINT HEAD UNIVERSAL NS (MISCELLANEOUS) ×3 IMPLANT
RETRIEVER SUT HEWSON (MISCELLANEOUS) ×3 IMPLANT
SET HNDPC FAN SPRY TIP SCT (DISPOSABLE) ×1 IMPLANT
SLING ARM FOAM STRAP LRG (SOFTGOODS) ×3 IMPLANT
SLING ARM FOAM STRAP MED (SOFTGOODS) IMPLANT
SLING ARM FOAM STRAP XLG (SOFTGOODS) ×2 IMPLANT
SMARTMIX MINI TOWER (MISCELLANEOUS) ×3
SPONGE LAP 18X18 X RAY DECT (DISPOSABLE) ×3 IMPLANT
SPONGE LAP 4X18 RFD (DISPOSABLE) IMPLANT
STEM HUMERAL AEQUALIS 3X74 (Shoulder) IMPLANT
STRIP CLOSURE SKIN 1/2X4 (GAUZE/BANDAGES/DRESSINGS) ×2 IMPLANT
SUCTION FRAZIER HANDLE 10FR (MISCELLANEOUS) ×2
SUCTION TUBE FRAZIER 10FR DISP (MISCELLANEOUS) ×1 IMPLANT
SUPPORT WRAP ARM LG (MISCELLANEOUS) ×3 IMPLANT
SUT ETHIBOND NAB CT1 #1 30IN (SUTURE) ×11 IMPLANT
SUT FIBERWIRE #2 38 T-5 BLUE (SUTURE)
SUT MNCRL AB 4-0 PS2 18 (SUTURE) ×3 IMPLANT
SUT VIC AB 0 CT1 27 (SUTURE) ×3
SUT VIC AB 0 CT1 27XBRD ANBCTR (SUTURE) IMPLANT
SUT VIC AB 2-0 CT1 27 (SUTURE) ×3
SUT VIC AB 2-0 CT1 TAPERPNT 27 (SUTURE) ×1 IMPLANT
SUTURE FIBERWR #2 38 T-5 BLUE (SUTURE) IMPLANT
TAPE LABRALWHITE 1.5X36 (TAPE) ×3 IMPLANT
TAPE SUT LABRALTAP WHT/BLK (SUTURE) ×3 IMPLANT
TOWEL OR 17X26 10 PK STRL BLUE (TOWEL DISPOSABLE) ×3 IMPLANT
TOWER SMARTMIX MINI (MISCELLANEOUS) ×1 IMPLANT
TUBE CONNECTING 12'X1/4 (SUCTIONS) ×1
TUBE CONNECTING 12X1/4 (SUCTIONS) ×1 IMPLANT

## 2018-06-27 NOTE — Transfer of Care (Signed)
Immediate Anesthesia Transfer of Care Note  Patient: Jesus Mata  Procedure(s) Performed: RIGHT TOTAL SHOULDER ARTHROPLASTY (Right Shoulder)  Patient Location: PACU  Anesthesia Type:General and Regional  Level of Consciousness: awake, alert , oriented, patient cooperative and responds to stimulation  Airway & Oxygen Therapy: Patient Spontanous Breathing and Patient connected to nasal cannula oxygen  Post-op Assessment: Report given to RN and Post -op Vital signs reviewed and stable  Post vital signs: Reviewed and stable  Last Vitals:  Vitals Value Taken Time  BP 116/83 06/27/2018 12:49 PM  Temp    Pulse 65 06/27/2018 12:50 PM  Resp 13 06/27/2018 12:50 PM  SpO2 99 % 06/27/2018 12:50 PM  Vitals shown include unvalidated device data.  Last Pain:  Vitals:   06/27/18 0813  TempSrc: Oral         Complications: No apparent anesthesia complications

## 2018-06-27 NOTE — Anesthesia Postprocedure Evaluation (Signed)
Anesthesia Post Note  Patient: Jesus Mata  Procedure(s) Performed: RIGHT TOTAL SHOULDER ARTHROPLASTY (Right Shoulder)     Patient location during evaluation: PACU Anesthesia Type: General Level of consciousness: sedated Pain management: pain level controlled Vital Signs Assessment: post-procedure vital signs reviewed and stable Respiratory status: spontaneous breathing and respiratory function stable Cardiovascular status: stable Postop Assessment: no apparent nausea or vomiting Anesthetic complications: no    Last Vitals:  Vitals:   06/27/18 1403 06/27/18 1418  BP: 124/87 124/84  Pulse: 64 62  Resp: 17 17  Temp:    SpO2: 100% 99%    Last Pain:  Vitals:   06/27/18 1418  TempSrc:   PainSc: 0-No pain                 Kandyce Dieguez DANIEL

## 2018-06-27 NOTE — Op Note (Signed)
Procedure(s): RIGHT TOTAL SHOULDER ARTHROPLASTY Procedure Note  Jesus Mata male 67 y.o. 06/27/2018  Procedure(s) and Anesthesia Type:    * RIGHT TOTAL SHOULDER ARTHROPLASTY - General      Right shoulder long head biceps tenodesis  Postoperative diagnosis:  1.  Right shoulder end-stage arthritis 2.  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 male  With endstage right shoulder arthritis. Pain and dysfunction interfered with quality of life and nonoperative treatment with activity modification, NSAIDS and injections failed.     Surgeon: Isabella Stalling   Assistants: Jeanmarie Hubert PA-C [facility] was present and scrubbed throughout the procedure and was essential in positioning, retraction, exposure, and closure)  Anesthesia: General endotracheal anesthesia with preoperative interscalene block given by the attending anesthesiologist    Procedure Detail  RIGHT TOTAL SHOULDER ARTHROPLASTY  Findings: Tornier flex anatomic press-fit size 3 stem with a 48 head, cemented size 48 Cortiloc glenoid.   A lesser tuberosity osteotomy was performed and repaired at the conclusion of the procedure.  Estimated Blood Loss:  200 mL         Drains: None   Blood Given: none          Specimens: none        Complications:  * No complications entered in OR log *         Disposition: PACU - hemodynamically stable.         Condition: stable    Procedure:   The patient was identified in the preoperative holding area where I personally marked the operative extremity after verifying with the patient and consent. He  was taken to the operating room where He was transferred to the   operative table.  The patient received an interscalene block in   the holding area by the attending anesthesiologist.  General anesthesia was induced   in the operating room without complication.  The patient  did receive IV  Ancef prior to the commencement of the procedure.  The patient was   placed in the beach-chair position with the back raised about 30   degrees.  The nonoperative extremity and head and neck were carefully   positioned and padded protecting against neurovascular compromise.  The   left upper extremity was then prepped and draped in the standard sterile   fashion.    The appropriate operative time-out was performed with   Anesthesia, the perioperative staff, as well as myself and we all agreed   that the right side was the correct operative site. The patient received 1 g IV tranexamic acid at the start of the case around time of the incision.  An approximately   10 cm incision was made from the tip of the coracoid to the center point of the   humerus at the level of the axilla.  Dissection was carried down sharply   through subcutaneous tissues and cephalic vein was identified and taken   laterally with the deltoid.  The pectoralis major was taken medially.  The   upper 1 cm of the pectoralis major was released from its attachment on   the humerus.  The clavipectoral fascia was incised just lateral to the   conjoined tendon.  This incision was carried up to but not into the   coracoacromial ligament.  Digital palpation was used to prove   integrity of the axillary nerve which was protected  throughout the   procedure.  Musculocutaneous nerve was not palpated in the operative   field.  Conjoined tendon was then retracted gently medially and the   deltoid laterally.  Anterior circumflex humeral vessels were clamped and   coagulated.  The soft tissues overlying the biceps was incised and this   incision was carried across the transverse humeral ligament to the base   of the coracoid.  The biceps was noted to be severely degenerated. It was released from the superior labrum. The biceps was then tenodesed to the soft tissue just above   pectoralis major and the remaining portion of  the biceps superiorly was   excised.  An osteotomy was performed at the lesser tuberosity.  The capsule was then   released all the way down to the 6 o'clock position of the humeral head.   The humeral head was then delivered with simultaneous adduction,   extension and external rotation.  All humeral osteophytes were removed   and the anatomic neck of the humerus was marked and cut free hand at   approximately 25 degrees retroversion within about 3 mm of the cuff   reflection posteriorly.  The head size was estimated to be a 48 medium   offset.  At that point, the humeral head was retracted posteriorly with   a Fukuda retractor.   Remaining portion of the capsule was released at the base of the   coracoid.  The remaining biceps anchor and the entire anterior-inferior   labrum was excised.  The posterior labrum was also excised but the   posterior capsule was not released.  The guidepin was placed bicortically with 5 deg elevated guide.  The reamer was used to ream to concentric bone with punctate bleeding.  This gave an excellent concentric surface.  The center hole was then drilled for an anchor peg glenoid followed by the three peripheral holes and none of the holes   exited the glenoid wall.  I then pulse irrigated these holes and dried   them with Surgicel.  The three peripheral holes were then   pressurized cemented and the anchor peg glenoid was placed and impacted   with an excellent fit.  The glenoid was a 88M component.  The proximal humerus was then again exposed taking care not to displace the glenoid.    The entry awl was used followed by sounding reamers and then sequentially broached from size 1-3. This was then left in place and the calcar planer was used. Trial head was placed with a 48.  With the trial implantation of the component,  there was approximately 50% posterior translation with immediate snap back to the   anatomic position.  With forward elevation, there was no  tendency   towards posterior subluxation.   The trial was removed and the final implant was prepared on a back table.  The trial was removed and the final implant was prepared on a back table.   3 small holes were drilled on the medial side of the lesser tuberosity osteotomy, through which 2 labral tapes were passed. The implant was then placed through the loop of the 2 labral tapes and impacted with an excellent press-fit. This achieved excellent anatomic reconstruction of the proximal humerus.  The joint was then copiously irrigated with pulse lavage.  The subscapularis and   lesser tuberosity osteotomy were then repaired using the 2 labral tapes previously passed in a double row fashion with horizontal mattress sutures medially brought over  through bone tunnels tied over a bone bridge laterally.   One #1 Ethibond was placed at the rotator interval just above   the lesser tuberosity. Copious irrigation was used. Skin was closed with 2-0 Vicryl sutures in the deep dermal layer and 4-0 Monocryl in a subcuticular  running fashion.  Sterile dressings were then applied including Aquacel.  The patient was placed in a sling and allowed to awaken from general anesthesia and taken to the recovery room in stable condition.      POSTOPERATIVE PLAN:  Early passive range of motion will be allowed with the goal of 0 degrees external rotation and 90 degrees forward elevation.  No internal rotation at this time.  No active motion of the arm until the lesser tuberosity heals.  The patient will likely be kept in the hospital for 1-2 days and then discharged home.

## 2018-06-27 NOTE — Anesthesia Preprocedure Evaluation (Addendum)
Anesthesia Evaluation  Patient identified by MRN, date of birth, ID band Patient awake    Reviewed: Allergy & Precautions, NPO status , Patient's Chart, lab work & pertinent test results  Airway Mallampati: II  TM Distance: >3 FB Neck ROM: Full    Dental  (+) Chipped, Dental Advisory Given,    Pulmonary neg pulmonary ROS,    breath sounds clear to auscultation       Cardiovascular negative cardio ROS   Rhythm:Regular Rate:Normal     Neuro/Psych negative neurological ROS     GI/Hepatic negative GI ROS, Neg liver ROS,   Endo/Other  negative endocrine ROS  Renal/GU negative Renal ROS     Musculoskeletal negative musculoskeletal ROS (+)   Abdominal Normal abdominal exam  (+)   Peds  Hematology negative hematology ROS (+)   Anesthesia Other Findings   Reproductive/Obstetrics                            Anesthesia Physical Anesthesia Plan  ASA: II  Anesthesia Plan: General   Post-op Pain Management: GA combined w/ Regional for post-op pain   Induction: Intravenous  PONV Risk Score and Plan: 3 and Ondansetron, Dexamethasone and Midazolam  Airway Management Planned: Oral ETT  Additional Equipment: None  Intra-op Plan:   Post-operative Plan: Extubation in OR  Informed Consent: I have reviewed the patients History and Physical, chart, labs and discussed the procedure including the risks, benefits and alternatives for the proposed anesthesia with the patient or authorized representative who has indicated his/her understanding and acceptance.   Dental advisory given  Plan Discussed with: CRNA  Anesthesia Plan Comments:         Lab Results  Component Value Date   WBC 6.4 06/19/2018   HGB 13.9 06/19/2018   HCT 40.7 06/19/2018   MCV 90.6 06/19/2018   PLT 241 06/19/2018   Lab Results  Component Value Date   CREATININE 1.22 06/19/2018   BUN 17 06/19/2018   NA 138  06/19/2018   K 3.4 (L) 06/19/2018   CL 103 06/19/2018   CO2 25 06/19/2018   Lab Results  Component Value Date   INR 0.98 06/19/2018   EKG: normal sinus rhythm.  Anesthesia Quick Evaluation

## 2018-06-27 NOTE — Anesthesia Procedure Notes (Signed)
Anesthesia Regional Block: Interscalene brachial plexus block   Pre-Anesthetic Checklist: ,, timeout performed, Correct Patient, Correct Site, Correct Laterality, Correct Procedure, Correct Position, site marked, Risks and benefits discussed,  Surgical consent,  Pre-op evaluation,  At surgeon's request and post-op pain management  Laterality: Right  Prep: chloraprep       Needles:  Injection technique: Single-shot  Needle Type: Echogenic Needle     Needle Length: 9cm  Needle Gauge: 21     Additional Needles:   Procedures:,,,, ultrasound used (permanent image in chart),,,,  Narrative:  Start time: 06/27/2018 9:25 AM End time: 06/27/2018 9:35 AM Injection made incrementally with aspirations every 5 mL.  Performed by: Personally  Anesthesiologist: Effie Berkshire, MD  Additional Notes: Patient tolerated the procedure well. Local anesthetic introduced in an incremental fashion under minimal resistance after negative aspirations. No paresthesias were elicited. After completion of the procedure, no acute issues were identified and patient continued to be monitored by RN.

## 2018-06-27 NOTE — H&P (Signed)
Jesus Mata is an 67 y.o. male.   Chief Complaint: Right shoulder pain and dysfunction HPI: Endstage R shoulder arthritis with significant pain and dysfunction, failed conservative measures.  Pain interferes with sleep and quality of life.   Past Medical History:  Diagnosis Date  . Cancer ([place])    left eye cancer (sdquamous cell carcinoma)  . High cholesterol   . Vertigo    takes hydrochlorothiazide    Past Surgical History:  Procedure Laterality Date  . COLONOSCOPY, ESOPHAGOGASTRODUODENOSCOPY (EGD) AND ESOPHAGEAL DILATION    . EYE SURGERY     left eye cancer, bilateral cataracts  . KNEE SURGERY Right    menicus    History reviewed. No pertinent family history. Social History:  reports that he has never smoked. He has quit using smokeless tobacco. He reports that he drinks about 4.2 oz of alcohol per week. He reports that he does not use drugs.  Allergies: No Known Allergies  Medications Prior to Admission  Medication Sig Dispense Refill  . atorvastatin (LIPITOR) 20 MG tablet Take 10 mg by mouth every other day.    . hydrochlorothiazide (HYDRODIURIL) 50 MG tablet Take 50 mg by mouth daily.  2  . Multiple Vitamins-Minerals (MULTIVITAMIN ADULT PO) Take 1 tablet by mouth daily.      No results found for this or any previous visit (from the past 48 hour(s)). No results found.  Review of Systems  All other systems reviewed and are negative.   Blood pressure 115/73, pulse 63, temperature (!) 97.5 F (36.4 C), temperature source Oral, resp. rate 20, height 5\' 10"  (1.778 m), weight 97.5 kg (215 lb), SpO2 99 %. Physical Exam  Constitutional: He is oriented to person, place, and time. He appears well-developed and well-nourished.  HENT:  Head: Atraumatic.  Eyes: EOM are normal.  Cardiovascular: Intact distal pulses.  Respiratory: Effort normal.  Musculoskeletal:  Right shoulder pain with limited range of motion.  Neurovascularly intact distally.  Neurological: He is  alert and oriented to person, place, and time.  Skin: Skin is warm and dry.  Psychiatric: He has a normal mood and affect.     Assessment/Plan Endstage R shoulder arthritis with significant pain and dysfunction, failed conservative measures.  Pain interferes with sleep and quality of life. Plan R TSA Risks / benefits of surgery discussed Consent on chart  NPO for OR Preop antibiotics    Isabella Stalling, MD 06/27/2018, 9:01 AM

## 2018-06-27 NOTE — Discharge Instructions (Signed)

## 2018-06-28 ENCOUNTER — Encounter (HOSPITAL_COMMUNITY): Payer: Self-pay | Admitting: Orthopedic Surgery

## 2018-06-28 LAB — BASIC METABOLIC PANEL
Anion gap: 8 (ref 5–15)
BUN: 22 mg/dL (ref 8–23)
CO2: 22 mmol/L (ref 22–32)
Calcium: 8 mg/dL — ABNORMAL LOW (ref 8.9–10.3)
Chloride: 107 mmol/L (ref 98–111)
Creatinine, Ser: 1.11 mg/dL (ref 0.61–1.24)
GFR calc Af Amer: >60 mL/min (ref >60)
GLUCOSE: 124 mg/dL — AB (ref 70–99)
Potassium: 4 mmol/L (ref 3.5–5.1)
Sodium: 137 mmol/L (ref 135–145)

## 2018-06-28 LAB — CBC
HEMATOCRIT: 34.1 % — AB (ref 39.0–52.0)
Hemoglobin: 11.8 g/dL — ABNORMAL LOW (ref 13.0–17.0)
MCH: 31.4 pg (ref 26.0–34.0)
MCHC: 34.6 g/dL (ref 30.0–36.0)
MCV: 90.7 fL (ref 78.0–100.0)
PLATELETS: 221 10*3/uL (ref 150–400)
RBC: 3.76 MIL/uL — AB (ref 4.22–5.81)
RDW: 13.2 % (ref 11.5–15.5)
WBC: 11.2 10*3/uL — ABNORMAL HIGH (ref 4.0–10.5)

## 2018-06-28 NOTE — Evaluation (Signed)
Occupational Therapy Evaluation Patient Details Name: Jesus Mata MRN: [id number] DOB: [birth year] Today's Date: 06/28/2018    History of Present Illness This 67 y.o. male admitted for Rt TSA.  PMH includes:  vertigo   Clinical Impression   Patient evaluated by Occupational Therapy with no further acute OT needs identified. All education has been completed and the patient has no further questions. All education completed.  Pt and spouse demonstrated good understanding of precautions.  Needs minimal reinforcement to avoid attempting to abduct Rt shoulder during ADLs.  See below for any follow-up Occupational Therapy or equipment needs. OT is signing off. Thank you for this referral.      Follow Up Recommendations  Follow surgeon's recommendation for DC plan and follow-up therapies;Supervision - Intermittent    Equipment Recommendations  None recommended by OT    Recommendations for Other Services       Precautions / Restrictions Precautions Precautions: Shoulder Type of Shoulder Precautions: SlingAt all times except ADL / exercise Shoulder Interventions: At all times;Shoulder sling/immobilizer Precaution Booklet Issued: Yes (comment) Precaution Comments: shoulder information handout provided and reviewed with pt and wife  Required Braces or Orthoses: Sling Restrictions Weight Bearing Restrictions: Yes RUE Weight Bearing: Non weight bearing      Mobility Bed Mobility Overal bed mobility: Modified Independent             General bed mobility comments: demonstrates safe technique.  He may sleep in recliner   Transfers Overall transfer level: Independent                    Balance                                           ADL either performed or assessed with clinical judgement   ADL Overall ADL's : Needs assistance/impaired                     Lower Body Dressing: Supervision/safety;Sit to/from stand                        Vision         Perception     Praxis      Pertinent Vitals/Pain Pain Assessment: 0-10 Pain Score: 2  Pain Location: Rt shoulder  Pain Descriptors / Indicators: Operative site guarding Pain Intervention(s): Monitored during session     Hand Dominance (ambidextrous )   Extremity/Trunk Assessment             Communication Communication Communication: No difficulties   Cognition Arousal/Alertness: Awake/alert Behavior During Therapy: WFL for tasks assessed/performed Overall Cognitive Status: Within Functional Limits for tasks assessed                                     General Comments  wife present.  Pt tends to try to abduct shoulder away from body during ADL activities.  Emphasized NO abduction of shoulder and to keep Rt elbow against body when out of sling.  THis was also written on handouts provided to pt and wife     Exercises Exercises: Shoulder Shoulder Exercises Elbow Flexion: AROM;Right;10 reps;Standing Elbow Extension: AROM;Right;10 reps;Standing Wrist Flexion: AROM;Right;10 reps;Standing Wrist Extension: AROM;Right;10 reps;Standing Digit Composite Flexion: AROM;Left;10 reps;Standing Composite Extension: AROM;Right;10 reps;Standing Neck Flexion: 5  reps;AROM;Standing Neck Extension: AROM;5 reps;Standing Neck Lateral Flexion - Right: AROM;5 reps;Standing Neck Lateral Flexion - Left: AROM;5 reps;Standing   Shoulder Instructions Shoulder Instructions Donning/doffing shirt without moving shoulder: Patient able to independently direct caregiver;Caregiver independent with task;Minimal assistance Method for sponge bathing under operated UE: Supervision/safety(Pt reports PA instructed him to lean forward and let UE dang) Donning/doffing sling/immobilizer: Minimal assistance;Caregiver independent with task;Patient able to independently direct caregiver Correct positioning of sling/immobilizer: Modified independent ROM for elbow, wrist and  digits of operated UE: Supervision/safety Sling wearing schedule (on at all times/off for ADL's): Independent Proper positioning of operated UE when showering: Supervision/safety;Patient able to independently direct caregiver;Caregiver independent with task Positioning of UE while sleeping: [place] expects to be discharged to:: Private residence Living Arrangements: Spouse/significant other                               Additional Comments: spouse able to assist pt at needed       Prior Functioning/Environment Level of Independence: Independent                 OT Problem List: Decreased strength;Decreased range of motion;Decreased knowledge of use of DME or AE;Impaired UE functional use;Pain      OT Treatment/Interventions:      OT Goals(Current goals can be found in the care plan section) Acute Rehab OT Goals Patient Stated Goal: to get back to working out  OT Goal Formulation: All assessment and education complete, DC therapy  OT Frequency:     Barriers to D/C:            Co-evaluation              AM-PAC PT "6 Clicks" Daily Activity     Outcome Measure Help from another person eating meals?: None Help from another person taking care of personal grooming?: None Help from another person toileting, which includes using toliet, bedpan, or urinal?: None Help from another person bathing (including washing, rinsing, drying)?: A Little Help from another person to put on and taking off regular upper body clothing?: A Little Help from another person to put on and taking off regular lower body clothing?: None 6 Click Score: 22   End of Session Equipment Utilized During Treatment: Other (comment)(sling ) Nurse Communication: Mobility status  Activity Tolerance: Patient tolerated treatment well Patient left: in bed;with call bell/phone within reach;with family/visitor present  OT Visit Diagnosis: Pain Pain -  Right/Left: Right Pain - part of body: Shoulder                Time: 4481-8563 OT Time Calculation (min): 25 min Charges:  OT General Charges $OT Visit: 1 Visit OT Evaluation $OT Eval Low Complexity: 1 Low OT Treatments $Self Care/Home Management : 8-22 mins G-Codes:     [facility], OTR/L [phone]   Lucille Passy M 06/28/2018, 12:18 PM

## 2018-06-28 NOTE — Progress Notes (Signed)
   PATIENT ID: Jesus Mata   1 Day Post-Op Procedure(s) (LRB): RIGHT TOTAL SHOULDER ARTHROPLASTY (Right)  Subjective: Doing well some pain R shoulder.   Objective:  Vitals:   06/28/18 0000 06/28/18 0510  BP: 120/80 103/64  Pulse: 77 66  Resp: 18 18  Temp:  98 F (36.7 C)  SpO2: 98% 96%     R UE dressing c/d/i Wiggles fingers, distally NVI  Labs:  Recent Labs    06/28/18 0417  HGB 11.8*   Recent Labs    06/28/18 0417  WBC 11.2*  RBC 3.76*  HCT 34.1*  PLT 221   Recent Labs    06/28/18 0417  NA 137  K 4.0  CL 107  CO2 22  BUN 22  CREATININE 1.11  GLUCOSE 124*  CALCIUM 8.0*    Assessment and Plan: 1 day s/p right TSA OT- PROM goal to 90 RR O ER D/c home when cleared by OT Fu with Dr. Tamera Punt in 2 weeks  VTE proph: asa, SCDs

## 2018-06-28 NOTE — Discharge Summary (Signed)
Patient ID: Jesus Mata MRN: [id number] DOB/AGE: 04-25-1951 67 y.o.  Admit date: 06/27/2018 Discharge date: 06/28/2018  Admission Diagnoses:  Active Problems:   Status post total shoulder arthroplasty, right   Discharge Diagnoses:  Same  Past Medical History:  Diagnosis Date  . Arthritis    "right shoulder" (06/27/2018)  . High cholesterol   . 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 Vertigo    takes hydrochlorothiazide    Surgeries: Procedure(s): RIGHT TOTAL SHOULDER ARTHROPLASTY on 06/27/2018   Consultants:   Discharged Condition: Improved  Hospital Course: Jesus Mata is an 67 y.o. male who was admitted 06/27/2018 for operative treatment of Right shoulder OA. Patient has severe unremitting pain that affects sleep, daily activities, and work/hobbies. After pre-op clearance the patient was taken to the operating room on 06/27/2018 and underwent  Procedure(s): RIGHT TOTAL SHOULDER ARTHROPLASTY.    Patient was given perioperative antibiotics:  Anti-infectives (From admission, onward)   Start     Dose/Rate Route Frequency Ordered Stop   06/27/18 1630  ceFAZolin (ANCEF) IVPB 2g/100 mL premix     2 g 200 mL/hr over 30 Minutes Intravenous Every 6 hours 06/27/18 1525 06/28/18 0610   06/27/18 1015  ceFAZolin (ANCEF) IVPB 2g/100 mL premix     2 g 200 mL/hr over 30 Minutes Intravenous On call to O.R. 06/27/18 0807 06/27/18 1039   06/27/18 0810  ceFAZolin (ANCEF) 2-4 GM/100ML-% IVPB    Note to Pharmacy:  Lisette Grinder   : cabinet override      06/27/18 0810 06/27/18 1039       Patient was given sequential compression devices, early ambulation, and chemoprophylaxis to prevent DVT.  Patient benefited maximally from hospital stay and there were no complications.    Recent vital signs:  Patient Vitals for the past 24 hrs:  BP Temp Temp src Pulse Resp SpO2 Height Weight  06/28/18 0510 103/64 98 F (36.7 C) Oral 66 18 96 % - -  06/28/18 0000 120/80 - -  77 18 98 % - -  06/27/18 2140 116/82 97.9 F (36.6 C) Oral 80 20 99 % - -  06/27/18 1530 128/84 (!) 97.5 F (36.4 C) Oral 70 - 99 % - -  06/27/18 1506 124/86 (!) 97.4 F (36.3 C) - 66 20 99 % - -  06/27/18 1448 129/82 - - 67 17 98 % - -  06/27/18 1418 124/84 - - 62 17 99 % - -  06/27/18 1403 124/87 - - 64 17 100 % - -  06/27/18 1348 120/83 - - 62 12 100 % - -  06/27/18 1334 (!) 126/55 - - 69 16 98 % - -  06/27/18 1318 115/79 - - 62 13 100 % - -  06/27/18 1303 114/77 - - 62 14 100 % - -  06/27/18 1249 116/83 (!) 97.4 F (36.3 C) - (!) 46 15 92 % - -  06/27/18 0925 112/68 - - 64 (!) 23 96 % - -  06/27/18 0920 118/77 - - 64 17 96 % - -  06/27/18 0915 128/78 - - (!) 56 15 99 % - -  06/27/18 0813 115/73 (!) 97.5 F (36.4 C) Oral 63 20 99 % 5\' 10"  (1.778 m) 97.5 kg (215 lb)     Recent laboratory studies:  Recent Labs    06/28/18 0417  WBC 11.2*  HGB 11.8*  HCT 34.1*  PLT 221  NA 137  K 4.0  CL 107  CO2 22  BUN 22  CREATININE 1.11  GLUCOSE 124*  CALCIUM 8.0*     Discharge Medications:   Allergies as of 06/28/2018   No Known Allergies     Medication List    TAKE these medications   atorvastatin 20 MG tablet Commonly known as:  LIPITOR Take 10 mg by mouth every other day.   docusate sodium 100 MG capsule Commonly known as:  COLACE Take 1 capsule (100 mg total) by mouth 3 (three) times daily as needed.   hydrochlorothiazide 50 MG tablet Commonly known as:  HYDRODIURIL Take 50 mg by mouth daily.   methocarbamol 500 MG tablet Commonly known as:  ROBAXIN Take 1 tablet (500 mg total) by mouth 3 (three) times daily.   MULTIVITAMIN ADULT PO Take 1 tablet by mouth daily.   oxyCODONE-acetaminophen 5-325 MG tablet Commonly known as:  PERCOCET Take 1-2 tablets by mouth every 4 (four) hours as needed for severe pain.       Diagnostic Studies: Dg Chest 2 View  Result Date: 06/19/2018 CLINICAL DATA:  Preoperative examination prior shoulder surgery. Former smoker.  EXAM: CHEST - 2 VIEW COMPARISON:  PA and lateral chest x-ray of December 21, 2013 FINDINGS: The lungs are well-expanded. There is no focal infiltrate. There is no pleural effusion. The heart and pulmonary vascularity are normal. The mediastinum is normal in width. There is calcification in the wall of the aortic arch. There is mild multilevel degenerative disc disease of the thoracic spine. IMPRESSION: There is no pneumonia nor other acute cardiopulmonary abnormality. Thoracic aortic atherosclerosis. Electronically Signed   By: David  Martinique M.D.   On: 06/19/2018 10:48   Ct Shoulder Right Wo Contrast  Result Date: 06/21/2018 CLINICAL DATA:  Surgical planning examination. Patient for right shoulder replacement. Chronic right shoulder pain. EXAM: CT OF THE UPPER RIGHT EXTREMITY WITHOUT CONTRAST TECHNIQUE: Multidetector CT imaging of the upper right extremity was performed according to the standard protocol. COMPARISON:  None. FINDINGS: Bones/Joint/Cartilage The patient has advanced glenohumeral osteoarthritis with bone-on-bone joint space narrowing and a large osteophyte off the humeral head. Scattered small subchondral cysts are identified in the glenoid. The glenoid is mildly flattened and remodeled. Mild to moderate acromioclavicular osteoarthritis is seen. The acromion is type 2. There is no acute bony or joint abnormality. No lytic or sclerotic lesion. Ligaments Suboptimally assessed by CT. Muscles and Tendons There is a calcification in the distal supraspinatus consistent with calcific tendinopathy. No rotator cuff tear is seen. Musculature of the shoulder girdle is preserved. Soft tissues Imaged lung parenchyma is clear. 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 Intact rotator cuff. Calcification in the supraspinatus consistent with tendinopathy noted. Electronically Signed   By: Inge Rise M.D.   On: 06/21/2018 14:32   Dg Shoulder Right  Port  Result Date: 06/27/2018 CLINICAL DATA:  Post right total shoulder arthroplasty. EXAM: PORTABLE RIGHT SHOULDER COMPARISON:  CT 06/21/2018 FINDINGS: Patient has undergone a total right shoulder arthroplasty. The entire humeral stem is visualized. No evidence for a periprosthetic fracture. Arthroplasty appears located on this single view. Prominent soft tissue calcifications just above the greater tubercle. IMPRESSION: Right shoulder arthroplasty. Electronically Signed   By: Markus Daft M.D.   On: 06/27/2018 14:27    Disposition: Discharge disposition: 01-Home or Self Care       Discharge Instructions    Call MD / Call 911   Complete by:  As directed    If you experience chest pain or shortness of  breath, CALL 911 and be transported to the hospital emergency room.  If you develope a fever above 101 F, pus (white drainage) or increased drainage or redness at the wound, or calf pain, call your surgeon's office.   Constipation Prevention   Complete by:  As directed    Drink plenty of fluids.  Prune juice may be helpful.  You may use a stool softener, such as Colace (over the counter) 100 mg twice a day.  Use MiraLax (over the counter) for constipation as needed.   Diet - low sodium heart healthy   Complete by:  As directed    Increase activity slowly as tolerated   Complete by:  As directed       Follow-up Information    Tania Ade, MD. Schedule an appointment as soon as possible for a visit in 2 weeks.   Specialty:  Orthopedic Surgery Contact information: [place]  [phone]            Signed: Grier Mitts 06/28/2018, 7:53 AM

## 2018-06-28 NOTE — Progress Notes (Signed)
Jesus Bells, RN gave pt 3 prescriptions and removed IV & started discharge instructions. Vicente Males, RN finished instructions, pt stated understanding does not need any equipment, was cleared by OT.

## 2018-07-31 DIAGNOSIS — L570 Actinic keratosis: Secondary | ICD-10-CM | POA: Diagnosis not present

## 2018-07-31 DIAGNOSIS — D044 Carcinoma in situ of skin of scalp and neck: Secondary | ICD-10-CM | POA: Diagnosis not present

## 2018-07-31 DIAGNOSIS — D225 Melanocytic nevi of trunk: Secondary | ICD-10-CM | POA: Diagnosis not present

## 2018-07-31 DIAGNOSIS — L821 Other seborrheic keratosis: Secondary | ICD-10-CM | POA: Diagnosis not present

## 2018-07-31 DIAGNOSIS — L814 Other melanin hyperpigmentation: Secondary | ICD-10-CM | POA: Diagnosis not present

## 2018-07-31 DIAGNOSIS — D1801 Hemangioma of skin and subcutaneous tissue: Secondary | ICD-10-CM | POA: Diagnosis not present

## 2018-08-14 DIAGNOSIS — E669 Obesity, unspecified: Secondary | ICD-10-CM | POA: Diagnosis not present

## 2018-08-14 DIAGNOSIS — E782 Mixed hyperlipidemia: Secondary | ICD-10-CM | POA: Diagnosis not present

## 2018-08-14 DIAGNOSIS — Z23 Encounter for immunization: Secondary | ICD-10-CM | POA: Diagnosis not present

## 2018-08-14 DIAGNOSIS — R7303 Prediabetes: Secondary | ICD-10-CM | POA: Diagnosis not present

## 2018-08-14 DIAGNOSIS — D649 Anemia, unspecified: Secondary | ICD-10-CM | POA: Diagnosis not present

## 2018-08-14 DIAGNOSIS — M79672 Pain in left foot: Secondary | ICD-10-CM | POA: Diagnosis not present

## 2018-08-14 DIAGNOSIS — M150 Primary generalized (osteo)arthritis: Secondary | ICD-10-CM | POA: Diagnosis not present

## 2018-08-14 DIAGNOSIS — H8102 Meniere's disease, left ear: Secondary | ICD-10-CM | POA: Diagnosis not present

## 2018-09-02 DIAGNOSIS — D4989 Neoplasm of unspecified behavior of other specified sites: Secondary | ICD-10-CM | POA: Diagnosis not present

## 2019-01-31 DIAGNOSIS — D485 Neoplasm of uncertain behavior of skin: Secondary | ICD-10-CM | POA: Diagnosis not present

## 2019-01-31 DIAGNOSIS — L080 Pyoderma: Secondary | ICD-10-CM | POA: Diagnosis not present

## 2019-01-31 DIAGNOSIS — L821 Other seborrheic keratosis: Secondary | ICD-10-CM | POA: Diagnosis not present

## 2019-01-31 DIAGNOSIS — L814 Other melanin hyperpigmentation: Secondary | ICD-10-CM | POA: Diagnosis not present

## 2019-01-31 DIAGNOSIS — D225 Melanocytic nevi of trunk: Secondary | ICD-10-CM | POA: Diagnosis not present

## 2019-01-31 DIAGNOSIS — L720 Epidermal cyst: Secondary | ICD-10-CM | POA: Diagnosis not present

## 2019-01-31 DIAGNOSIS — L570 Actinic keratosis: Secondary | ICD-10-CM | POA: Diagnosis not present

## 2019-01-31 DIAGNOSIS — D2261 Melanocytic nevi of right upper limb, including shoulder: Secondary | ICD-10-CM | POA: Diagnosis not present

## 2019-02-12 DIAGNOSIS — Z0000 Encounter for general adult medical examination without abnormal findings: Secondary | ICD-10-CM | POA: Diagnosis not present

## 2019-02-12 DIAGNOSIS — D649 Anemia, unspecified: Secondary | ICD-10-CM | POA: Diagnosis not present

## 2019-02-12 DIAGNOSIS — N528 Other male erectile dysfunction: Secondary | ICD-10-CM | POA: Diagnosis not present

## 2019-02-12 DIAGNOSIS — M150 Primary generalized (osteo)arthritis: Secondary | ICD-10-CM | POA: Diagnosis not present

## 2019-02-12 DIAGNOSIS — R7303 Prediabetes: Secondary | ICD-10-CM | POA: Diagnosis not present

## 2019-02-12 DIAGNOSIS — H8102 Meniere's disease, left ear: Secondary | ICD-10-CM | POA: Diagnosis not present

## 2019-02-12 DIAGNOSIS — H9313 Tinnitus, bilateral: Secondary | ICD-10-CM | POA: Diagnosis not present

## 2019-02-12 DIAGNOSIS — E782 Mixed hyperlipidemia: Secondary | ICD-10-CM | POA: Diagnosis not present

## 2019-02-12 DIAGNOSIS — E669 Obesity, unspecified: Secondary | ICD-10-CM | POA: Diagnosis not present

## 2019-02-12 DIAGNOSIS — Z125 Encounter for screening for malignant neoplasm of prostate: Secondary | ICD-10-CM | POA: Diagnosis not present

## 2019-02-12 DIAGNOSIS — Z85828 Personal history of other malignant neoplasm of skin: Secondary | ICD-10-CM | POA: Diagnosis not present

## 2019-05-13 IMAGING — CT CT SHOULDER*R* W/O CM
2 series · 10 of 14 positions shown, 12 images · non-contrast
Comparison: None.

CLINICAL DATA: Surgical planning examination. Patient for right
shoulder replacement. Chronic right shoulder pain.

EXAM:
CT OF THE UPPER RIGHT EXTREMITY WITHOUT CONTRAST
TECHNIQUE: Multidetector CT imaging of the upper right extremity was performed
according to the standard protocol.

[Series 3: shoulder 2.00 br40 s3 ax · axial · 0.48mm/px · z∈[-765,-639]mm · 5 of 95 slices shown, 7 images]
[im 16/95  soft-tissue]
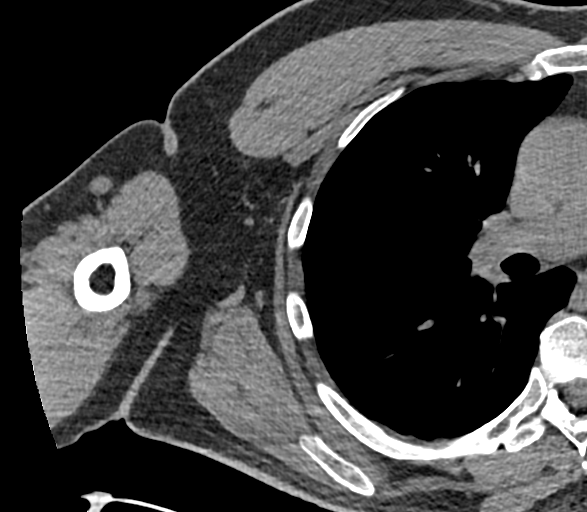
[im 16/95  bone]
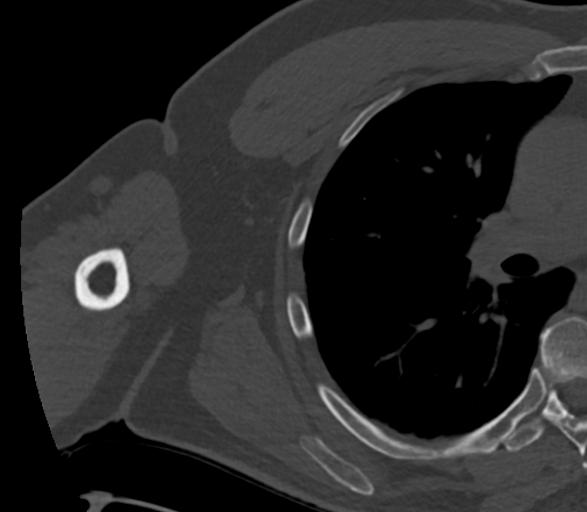
[im 32/95  bone]
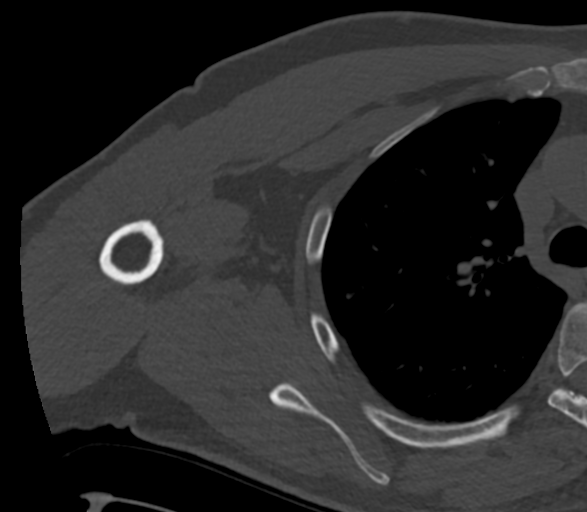
[im 48/95  bone]
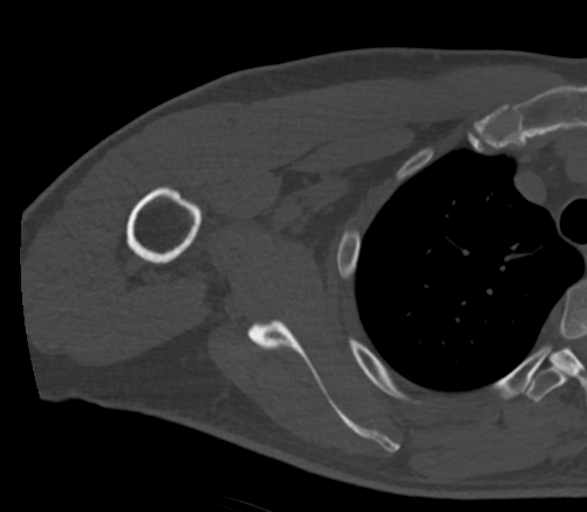
[im 63/95  bone]
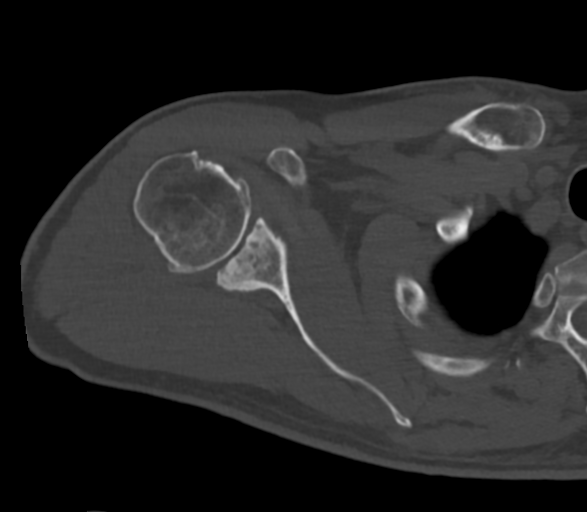
[im 79/95  soft-tissue]
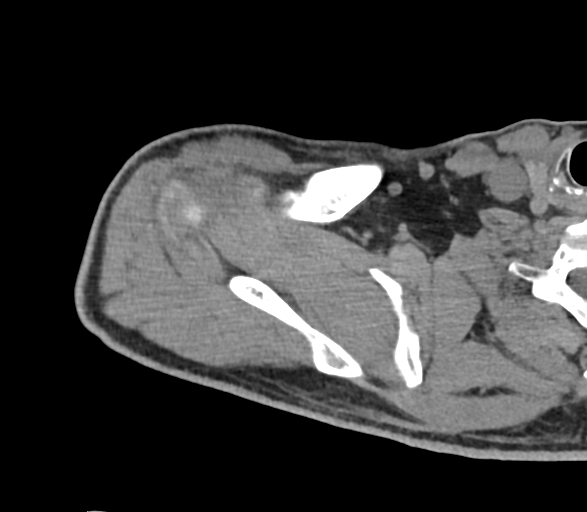
[im 79/95  bone]
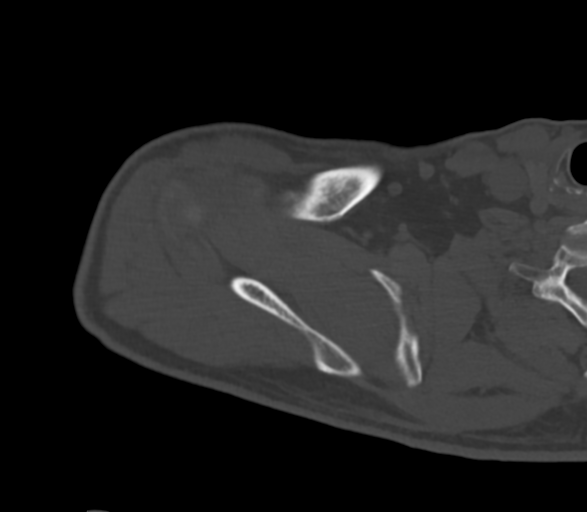

[Series 5: shoulder 2.00 br60 s3 ax · axial · 0.48mm/px · z∈[-765,-639]mm · 5 of 95 slices shown]
[im 16/95  bone]
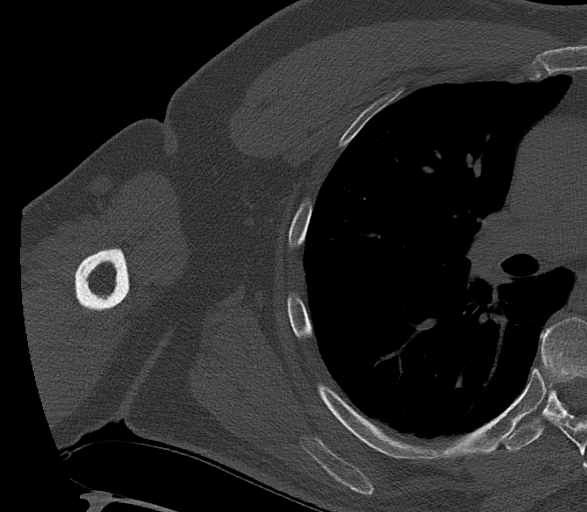
[im 32/95  bone]
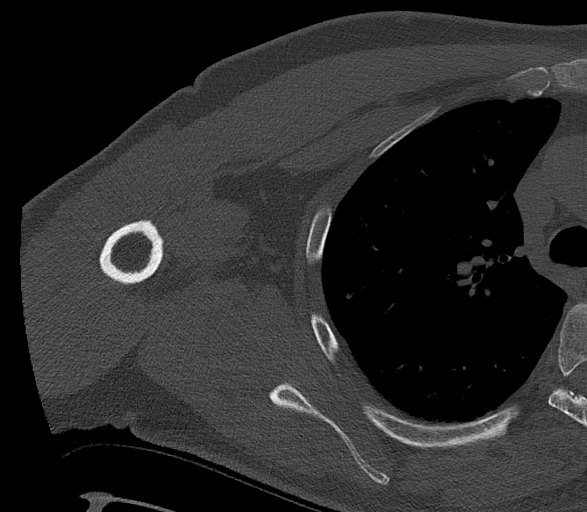
[im 48/95  bone]
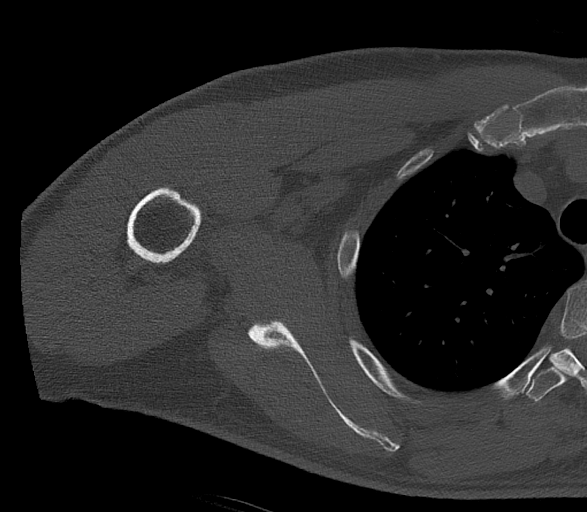
[im 63/95  bone]
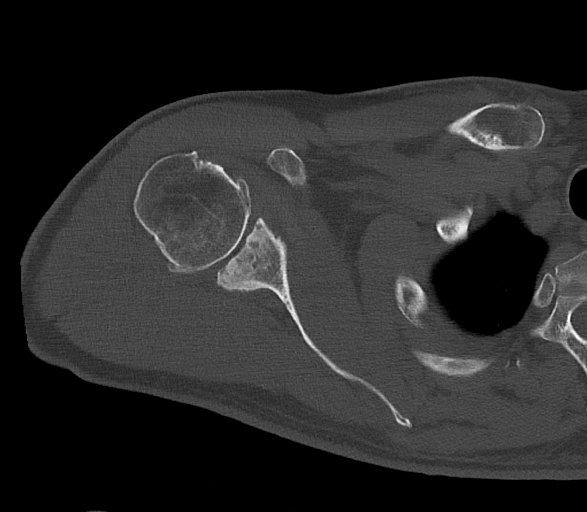
[im 79/95  bone]
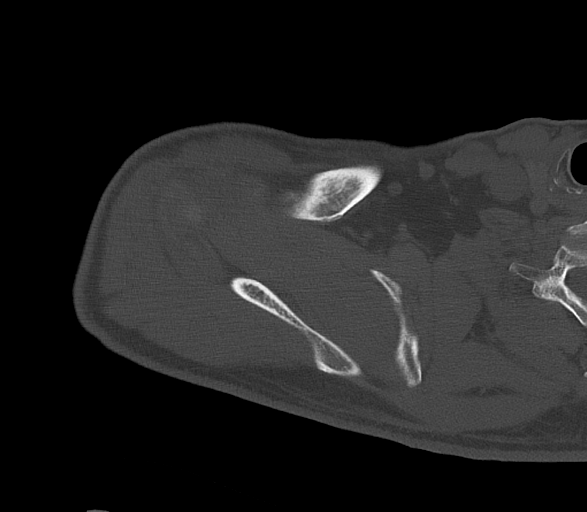

[10 of 14 positions shown; findings below may reference images not displayed]

FINDINGS: Bones/Joint/Cartilage

The patient has advanced glenohumeral osteoarthritis with
bone-on-bone joint space narrowing and a large osteophyte off the
humeral head. Scattered small subchondral cysts are identified in
the glenoid. The glenoid is mildly flattened and remodeled.

Mild to moderate acromioclavicular osteoarthritis is seen. The
acromion is type 2. There is no acute bony or joint abnormality. No
lytic or sclerotic lesion.

Ligaments

Suboptimally assessed by CT.

Muscles and Tendons

There is a calcification in the distal supraspinatus consistent with
calcific tendinopathy. No rotator cuff tear is seen. Musculature of
the shoulder girdle is preserved.

Soft tissues

Imaged lung parenchyma is clear.
IMPRESSION: Advanced glenohumeral osteoarthritis.

Moderate acromioclavicular osteoarthritis.

Intact rotator cuff. Calcification in the supraspinatus consistent
with tendinopathy noted.

## 2019-05-19 IMAGING — DX DG SHOULDER 2+V PORT*R*
1 series · 1 of 1 positions shown · non-contrast
Comparison: CT 06/21/2018

CLINICAL DATA: Post right total shoulder arthroplasty.

EXAM:
PORTABLE RIGHT SHOULDER

[shoulder]
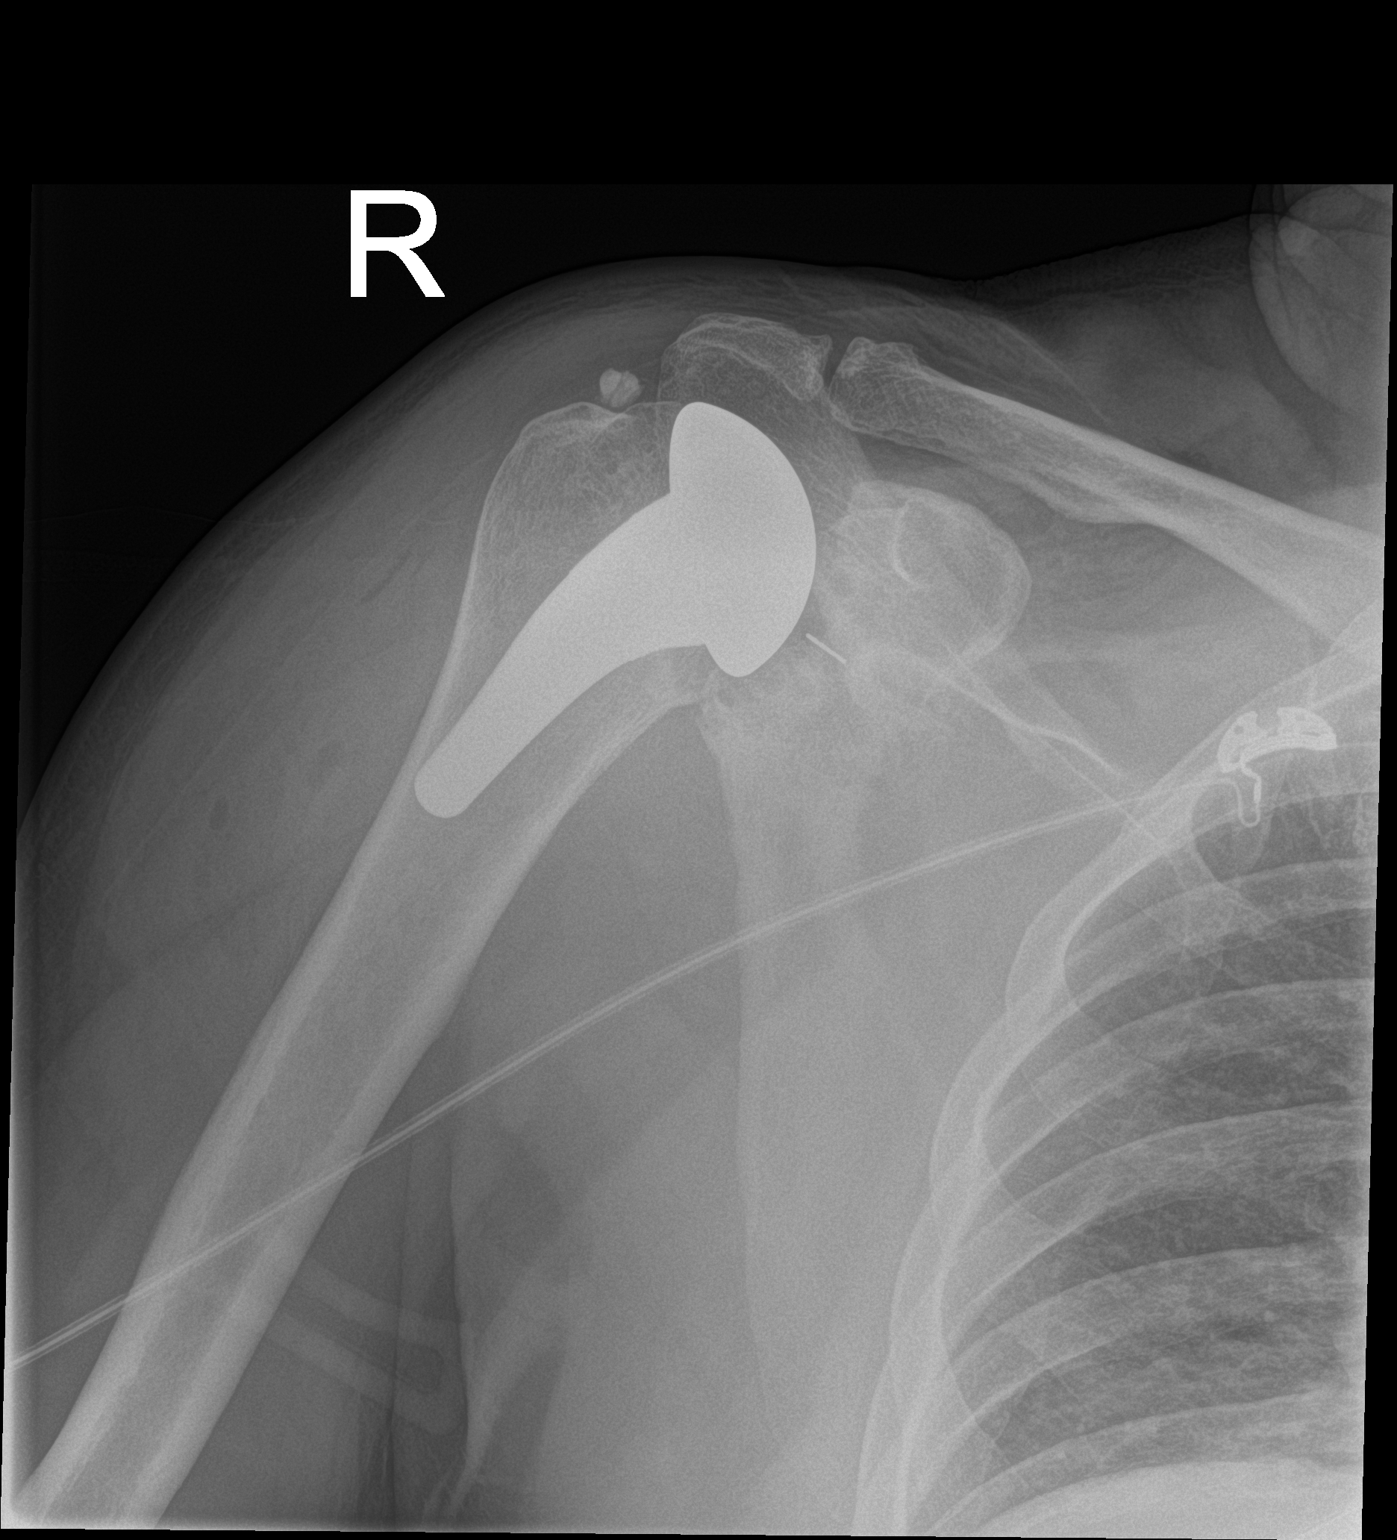

[1 of 1 positions shown; findings below may reference images not displayed]

FINDINGS: Patient has undergone a total right shoulder arthroplasty. The
entire humeral stem is visualized. No evidence for a periprosthetic
fracture. Arthroplasty appears located on this single view.
Prominent soft tissue calcifications just above the greater
tubercle.
IMPRESSION: Right shoulder arthroplasty.

## 2019-08-05 DIAGNOSIS — D2261 Melanocytic nevi of right upper limb, including shoulder: Secondary | ICD-10-CM | POA: Diagnosis not present

## 2019-08-05 DIAGNOSIS — L814 Other melanin hyperpigmentation: Secondary | ICD-10-CM | POA: Diagnosis not present

## 2019-08-05 DIAGNOSIS — L821 Other seborrheic keratosis: Secondary | ICD-10-CM | POA: Diagnosis not present

## 2019-08-05 DIAGNOSIS — L570 Actinic keratosis: Secondary | ICD-10-CM | POA: Diagnosis not present

## 2019-08-05 DIAGNOSIS — Z85828 Personal history of other malignant neoplasm of skin: Secondary | ICD-10-CM | POA: Diagnosis not present

## 2019-08-05 DIAGNOSIS — D2262 Melanocytic nevi of left upper limb, including shoulder: Secondary | ICD-10-CM | POA: Diagnosis not present

## 2019-08-05 DIAGNOSIS — D225 Melanocytic nevi of trunk: Secondary | ICD-10-CM | POA: Diagnosis not present

## 2019-09-08 DIAGNOSIS — Z961 Presence of intraocular lens: Secondary | ICD-10-CM | POA: Diagnosis not present

## 2019-09-08 DIAGNOSIS — D4989 Neoplasm of unspecified behavior of other specified sites: Secondary | ICD-10-CM | POA: Diagnosis not present

## 2019-09-08 DIAGNOSIS — H26491 Other secondary cataract, right eye: Secondary | ICD-10-CM | POA: Diagnosis not present

## 2019-10-20 DIAGNOSIS — E782 Mixed hyperlipidemia: Secondary | ICD-10-CM | POA: Diagnosis not present

## 2019-10-20 DIAGNOSIS — H9313 Tinnitus, bilateral: Secondary | ICD-10-CM | POA: Diagnosis not present

## 2019-10-20 DIAGNOSIS — Z23 Encounter for immunization: Secondary | ICD-10-CM | POA: Diagnosis not present

## 2019-10-20 DIAGNOSIS — M8949 Other hypertrophic osteoarthropathy, multiple sites: Secondary | ICD-10-CM | POA: Diagnosis not present

## 2019-10-20 DIAGNOSIS — R7303 Prediabetes: Secondary | ICD-10-CM | POA: Diagnosis not present

## 2019-10-20 DIAGNOSIS — H8102 Meniere's disease, left ear: Secondary | ICD-10-CM | POA: Diagnosis not present

## 2019-10-20 DIAGNOSIS — Z87891 Personal history of nicotine dependence: Secondary | ICD-10-CM | POA: Diagnosis not present

## 2019-10-20 DIAGNOSIS — D649 Anemia, unspecified: Secondary | ICD-10-CM | POA: Diagnosis not present

## 2019-10-20 DIAGNOSIS — I10 Essential (primary) hypertension: Secondary | ICD-10-CM | POA: Diagnosis not present

## 2020-02-03 DIAGNOSIS — D225 Melanocytic nevi of trunk: Secondary | ICD-10-CM | POA: Diagnosis not present

## 2020-02-03 DIAGNOSIS — L720 Epidermal cyst: Secondary | ICD-10-CM | POA: Diagnosis not present

## 2020-02-03 DIAGNOSIS — L820 Inflamed seborrheic keratosis: Secondary | ICD-10-CM | POA: Diagnosis not present

## 2020-02-03 DIAGNOSIS — L821 Other seborrheic keratosis: Secondary | ICD-10-CM | POA: Diagnosis not present

## 2020-02-03 DIAGNOSIS — D2262 Melanocytic nevi of left upper limb, including shoulder: Secondary | ICD-10-CM | POA: Diagnosis not present

## 2020-02-03 DIAGNOSIS — L814 Other melanin hyperpigmentation: Secondary | ICD-10-CM | POA: Diagnosis not present

## 2020-02-03 DIAGNOSIS — D485 Neoplasm of uncertain behavior of skin: Secondary | ICD-10-CM | POA: Diagnosis not present

## 2020-02-03 DIAGNOSIS — L570 Actinic keratosis: Secondary | ICD-10-CM | POA: Diagnosis not present

## 2020-02-16 DIAGNOSIS — R42 Dizziness and giddiness: Secondary | ICD-10-CM | POA: Diagnosis not present

## 2020-02-16 DIAGNOSIS — Z974 Presence of external hearing-aid: Secondary | ICD-10-CM | POA: Diagnosis not present

## 2020-02-16 DIAGNOSIS — H903 Sensorineural hearing loss, bilateral: Secondary | ICD-10-CM | POA: Diagnosis not present

## 2020-02-16 DIAGNOSIS — H8102 Meniere's disease, left ear: Secondary | ICD-10-CM | POA: Diagnosis not present

## 2020-02-16 DIAGNOSIS — H26491 Other secondary cataract, right eye: Secondary | ICD-10-CM | POA: Diagnosis not present

## 2020-03-01 DIAGNOSIS — H1131 Conjunctival hemorrhage, right eye: Secondary | ICD-10-CM | POA: Diagnosis not present

## 2020-04-19 DIAGNOSIS — Z85828 Personal history of other malignant neoplasm of skin: Secondary | ICD-10-CM | POA: Diagnosis not present

## 2020-04-19 DIAGNOSIS — H8102 Meniere's disease, left ear: Secondary | ICD-10-CM | POA: Diagnosis not present

## 2020-04-19 DIAGNOSIS — H903 Sensorineural hearing loss, bilateral: Secondary | ICD-10-CM | POA: Diagnosis not present

## 2020-04-19 DIAGNOSIS — I10 Essential (primary) hypertension: Secondary | ICD-10-CM | POA: Diagnosis not present

## 2020-04-19 DIAGNOSIS — E669 Obesity, unspecified: Secondary | ICD-10-CM | POA: Diagnosis not present

## 2020-04-19 DIAGNOSIS — E782 Mixed hyperlipidemia: Secondary | ICD-10-CM | POA: Diagnosis not present

## 2020-04-19 DIAGNOSIS — D649 Anemia, unspecified: Secondary | ICD-10-CM | POA: Diagnosis not present

## 2020-04-19 DIAGNOSIS — R7303 Prediabetes: Secondary | ICD-10-CM | POA: Diagnosis not present

## 2020-04-19 DIAGNOSIS — Z0000 Encounter for general adult medical examination without abnormal findings: Secondary | ICD-10-CM | POA: Diagnosis not present

## 2020-04-19 DIAGNOSIS — M8949 Other hypertrophic osteoarthropathy, multiple sites: Secondary | ICD-10-CM | POA: Diagnosis not present

## 2020-06-17 DIAGNOSIS — I10 Essential (primary) hypertension: Secondary | ICD-10-CM | POA: Diagnosis not present

## 2020-06-17 DIAGNOSIS — R509 Fever, unspecified: Secondary | ICD-10-CM | POA: Diagnosis not present

## 2020-06-18 DIAGNOSIS — R509 Fever, unspecified: Secondary | ICD-10-CM | POA: Diagnosis not present

## 2020-06-18 DIAGNOSIS — Z20822 Contact with and (suspected) exposure to covid-19: Secondary | ICD-10-CM | POA: Diagnosis not present

## 2020-07-19 DIAGNOSIS — R509 Fever, unspecified: Secondary | ICD-10-CM | POA: Diagnosis not present

## 2020-07-19 DIAGNOSIS — R768 Other specified abnormal immunological findings in serum: Secondary | ICD-10-CM | POA: Diagnosis not present

## 2020-08-06 DIAGNOSIS — Z20822 Contact with and (suspected) exposure to covid-19: Secondary | ICD-10-CM | POA: Diagnosis not present

## 2020-08-06 DIAGNOSIS — R05 Cough: Secondary | ICD-10-CM | POA: Diagnosis not present

## 2020-09-13 DIAGNOSIS — D4989 Neoplasm of unspecified behavior of other specified sites: Secondary | ICD-10-CM | POA: Diagnosis not present

## 2020-10-04 DIAGNOSIS — L814 Other melanin hyperpigmentation: Secondary | ICD-10-CM | POA: Diagnosis not present

## 2020-10-04 DIAGNOSIS — D225 Melanocytic nevi of trunk: Secondary | ICD-10-CM | POA: Diagnosis not present

## 2020-10-04 DIAGNOSIS — Z85828 Personal history of other malignant neoplasm of skin: Secondary | ICD-10-CM | POA: Diagnosis not present

## 2020-10-04 DIAGNOSIS — D0461 Carcinoma in situ of skin of right upper limb, including shoulder: Secondary | ICD-10-CM | POA: Diagnosis not present

## 2020-10-04 DIAGNOSIS — L720 Epidermal cyst: Secondary | ICD-10-CM | POA: Diagnosis not present

## 2020-10-04 DIAGNOSIS — D0462 Carcinoma in situ of skin of left upper limb, including shoulder: Secondary | ICD-10-CM | POA: Diagnosis not present

## 2020-10-04 DIAGNOSIS — L570 Actinic keratosis: Secondary | ICD-10-CM | POA: Diagnosis not present

## 2020-10-04 DIAGNOSIS — C44629 Squamous cell carcinoma of skin of left upper limb, including shoulder: Secondary | ICD-10-CM | POA: Diagnosis not present

## 2020-10-04 DIAGNOSIS — L821 Other seborrheic keratosis: Secondary | ICD-10-CM | POA: Diagnosis not present

## 2020-10-26 DIAGNOSIS — L720 Epidermal cyst: Secondary | ICD-10-CM | POA: Diagnosis not present

## 2020-10-26 DIAGNOSIS — Z85828 Personal history of other malignant neoplasm of skin: Secondary | ICD-10-CM | POA: Diagnosis not present

## 2020-10-26 DIAGNOSIS — L570 Actinic keratosis: Secondary | ICD-10-CM | POA: Diagnosis not present

## 2020-10-26 DIAGNOSIS — L821 Other seborrheic keratosis: Secondary | ICD-10-CM | POA: Diagnosis not present

## 2020-10-29 DIAGNOSIS — M67911 Unspecified disorder of synovium and tendon, right shoulder: Secondary | ICD-10-CM | POA: Diagnosis not present

## 2020-11-19 DIAGNOSIS — M25511 Pain in right shoulder: Secondary | ICD-10-CM | POA: Diagnosis not present

## 2021-04-05 DIAGNOSIS — L820 Inflamed seborrheic keratosis: Secondary | ICD-10-CM | POA: Diagnosis not present

## 2021-04-05 DIAGNOSIS — L814 Other melanin hyperpigmentation: Secondary | ICD-10-CM | POA: Diagnosis not present

## 2021-04-05 DIAGNOSIS — D1801 Hemangioma of skin and subcutaneous tissue: Secondary | ICD-10-CM | POA: Diagnosis not present

## 2021-04-05 DIAGNOSIS — L821 Other seborrheic keratosis: Secondary | ICD-10-CM | POA: Diagnosis not present

## 2021-04-05 DIAGNOSIS — D2262 Melanocytic nevi of left upper limb, including shoulder: Secondary | ICD-10-CM | POA: Diagnosis not present

## 2021-04-05 DIAGNOSIS — L570 Actinic keratosis: Secondary | ICD-10-CM | POA: Diagnosis not present

## 2021-04-05 DIAGNOSIS — Z85828 Personal history of other malignant neoplasm of skin: Secondary | ICD-10-CM | POA: Diagnosis not present

## 2021-04-05 DIAGNOSIS — D225 Melanocytic nevi of trunk: Secondary | ICD-10-CM | POA: Diagnosis not present

## 2021-04-25 DIAGNOSIS — Z125 Encounter for screening for malignant neoplasm of prostate: Secondary | ICD-10-CM | POA: Diagnosis not present

## 2021-04-25 DIAGNOSIS — R0683 Snoring: Secondary | ICD-10-CM | POA: Diagnosis not present

## 2021-04-25 DIAGNOSIS — Z96611 Presence of right artificial shoulder joint: Secondary | ICD-10-CM | POA: Diagnosis not present

## 2021-04-25 DIAGNOSIS — Z9889 Other specified postprocedural states: Secondary | ICD-10-CM | POA: Diagnosis not present

## 2021-04-25 DIAGNOSIS — Z0000 Encounter for general adult medical examination without abnormal findings: Secondary | ICD-10-CM | POA: Diagnosis not present

## 2021-04-25 DIAGNOSIS — N528 Other male erectile dysfunction: Secondary | ICD-10-CM | POA: Diagnosis not present

## 2021-04-25 DIAGNOSIS — E669 Obesity, unspecified: Secondary | ICD-10-CM | POA: Diagnosis not present

## 2021-04-25 DIAGNOSIS — D649 Anemia, unspecified: Secondary | ICD-10-CM | POA: Diagnosis not present

## 2021-04-25 DIAGNOSIS — I10 Essential (primary) hypertension: Secondary | ICD-10-CM | POA: Diagnosis not present

## 2021-04-25 DIAGNOSIS — H903 Sensorineural hearing loss, bilateral: Secondary | ICD-10-CM | POA: Diagnosis not present

## 2021-04-25 DIAGNOSIS — Z7982 Long term (current) use of aspirin: Secondary | ICD-10-CM | POA: Diagnosis not present

## 2021-04-25 DIAGNOSIS — E782 Mixed hyperlipidemia: Secondary | ICD-10-CM | POA: Diagnosis not present

## 2021-04-25 DIAGNOSIS — Z801 Family history of malignant neoplasm of trachea, bronchus and lung: Secondary | ICD-10-CM | POA: Diagnosis not present

## 2021-04-25 DIAGNOSIS — Z6831 Body mass index (BMI) 31.0-31.9, adult: Secondary | ICD-10-CM | POA: Diagnosis not present

## 2021-04-25 DIAGNOSIS — Z79899 Other long term (current) drug therapy: Secondary | ICD-10-CM | POA: Diagnosis not present

## 2021-04-25 DIAGNOSIS — Z8619 Personal history of other infectious and parasitic diseases: Secondary | ICD-10-CM | POA: Diagnosis not present

## 2021-04-25 DIAGNOSIS — Z85828 Personal history of other malignant neoplasm of skin: Secondary | ICD-10-CM | POA: Diagnosis not present

## 2021-04-25 DIAGNOSIS — R7303 Prediabetes: Secondary | ICD-10-CM | POA: Diagnosis not present

## 2021-04-25 DIAGNOSIS — M159 Polyosteoarthritis, unspecified: Secondary | ICD-10-CM | POA: Diagnosis not present

## 2021-04-25 DIAGNOSIS — Z87891 Personal history of nicotine dependence: Secondary | ICD-10-CM | POA: Diagnosis not present

## 2021-04-25 DIAGNOSIS — M7751 Other enthesopathy of right foot: Secondary | ICD-10-CM | POA: Diagnosis not present

## 2021-04-25 DIAGNOSIS — H8102 Meniere's disease, left ear: Secondary | ICD-10-CM | POA: Diagnosis not present

## 2021-04-25 DIAGNOSIS — Z0001 Encounter for general adult medical examination with abnormal findings: Secondary | ICD-10-CM | POA: Diagnosis not present

## 2021-05-03 DIAGNOSIS — H903 Sensorineural hearing loss, bilateral: Secondary | ICD-10-CM | POA: Diagnosis not present

## 2021-09-14 ENCOUNTER — Encounter: Payer: Self-pay | Admitting: Gastroenterology

## 2021-09-19 DIAGNOSIS — Z961 Presence of intraocular lens: Secondary | ICD-10-CM | POA: Diagnosis not present

## 2021-09-19 DIAGNOSIS — D4989 Neoplasm of unspecified behavior of other specified sites: Secondary | ICD-10-CM | POA: Diagnosis not present

## 2021-10-10 DIAGNOSIS — L814 Other melanin hyperpigmentation: Secondary | ICD-10-CM | POA: Diagnosis not present

## 2021-10-10 DIAGNOSIS — L720 Epidermal cyst: Secondary | ICD-10-CM | POA: Diagnosis not present

## 2021-10-10 DIAGNOSIS — D225 Melanocytic nevi of trunk: Secondary | ICD-10-CM | POA: Diagnosis not present

## 2021-10-10 DIAGNOSIS — L821 Other seborrheic keratosis: Secondary | ICD-10-CM | POA: Diagnosis not present

## 2021-10-10 DIAGNOSIS — L570 Actinic keratosis: Secondary | ICD-10-CM | POA: Diagnosis not present

## 2021-10-10 DIAGNOSIS — Z85828 Personal history of other malignant neoplasm of skin: Secondary | ICD-10-CM | POA: Diagnosis not present

## 2021-10-10 DIAGNOSIS — D485 Neoplasm of uncertain behavior of skin: Secondary | ICD-10-CM | POA: Diagnosis not present

## 2021-10-10 DIAGNOSIS — L820 Inflamed seborrheic keratosis: Secondary | ICD-10-CM | POA: Diagnosis not present

## 2021-11-14 DIAGNOSIS — M159 Polyosteoarthritis, unspecified: Secondary | ICD-10-CM | POA: Diagnosis not present

## 2021-11-14 DIAGNOSIS — N528 Other male erectile dysfunction: Secondary | ICD-10-CM | POA: Diagnosis not present

## 2021-11-14 DIAGNOSIS — D649 Anemia, unspecified: Secondary | ICD-10-CM | POA: Diagnosis not present

## 2021-11-14 DIAGNOSIS — H8102 Meniere's disease, left ear: Secondary | ICD-10-CM | POA: Diagnosis not present

## 2021-11-14 DIAGNOSIS — E669 Obesity, unspecified: Secondary | ICD-10-CM | POA: Diagnosis not present

## 2021-11-14 DIAGNOSIS — H903 Sensorineural hearing loss, bilateral: Secondary | ICD-10-CM | POA: Diagnosis not present

## 2021-11-14 DIAGNOSIS — Z85828 Personal history of other malignant neoplasm of skin: Secondary | ICD-10-CM | POA: Diagnosis not present

## 2021-11-14 DIAGNOSIS — R7303 Prediabetes: Secondary | ICD-10-CM | POA: Diagnosis not present

## 2021-11-14 DIAGNOSIS — I10 Essential (primary) hypertension: Secondary | ICD-10-CM | POA: Diagnosis not present

## 2021-11-14 DIAGNOSIS — E782 Mixed hyperlipidemia: Secondary | ICD-10-CM | POA: Diagnosis not present

## 2022-02-05 ENCOUNTER — Emergency Department (HOSPITAL_COMMUNITY)
Admission: EM | Admit: 2022-02-05 | Discharge: 2022-02-05 | Disposition: A | Discharge status: Home / Self Care | Payer: PPO | Attending: Emergency Medicine | Admitting: Emergency Medicine

## 2022-02-05 ENCOUNTER — Encounter (HOSPITAL_COMMUNITY): Payer: Self-pay | Admitting: Emergency Medicine

## 2022-02-05 ENCOUNTER — Other Ambulatory Visit: Payer: Self-pay

## 2022-02-05 ENCOUNTER — Emergency Department (HOSPITAL_COMMUNITY): Payer: PPO

## 2022-02-05 DIAGNOSIS — R079 Chest pain, unspecified: Secondary | ICD-10-CM | POA: Insufficient documentation

## 2022-02-05 DIAGNOSIS — R509 Fever, unspecified: Secondary | ICD-10-CM | POA: Diagnosis not present

## 2022-02-05 DIAGNOSIS — R0602 Shortness of breath: Secondary | ICD-10-CM | POA: Diagnosis present

## 2022-02-05 DIAGNOSIS — R059 Cough, unspecified: Secondary | ICD-10-CM | POA: Diagnosis not present

## 2022-02-05 HISTORY — DX: Pneumonia, unspecified organism: J18.9

## 2022-02-05 LAB — CBC
HCT: 39.4 % (ref 39.0–52.0)
Hemoglobin: 14 g/dL (ref 13.0–17.0)
MCH: 31.6 pg (ref 26.0–34.0)
MCHC: 35.5 g/dL (ref 30.0–36.0)
MCV: 88.9 fL (ref 80.0–100.0)
Platelets: 291 10*3/uL (ref 150–400)
RBC: 4.43 MIL/uL (ref 4.22–5.81)
RDW: 12.9 % (ref 11.5–15.5)
WBC: 12.9 10*3/uL — ABNORMAL HIGH (ref 4.0–10.5)
nRBC: 0 % (ref 0.0–0.2)

## 2022-02-05 LAB — BASIC METABOLIC PANEL
Anion gap: 10 (ref 5–15)
BUN: 12 mg/dL (ref 8–23)
CO2: 24 mmol/L (ref 22–32)
Calcium: 9 mg/dL (ref 8.9–10.3)
Chloride: 97 mmol/L — ABNORMAL LOW (ref 98–111)
Creatinine, Ser: 1.11 mg/dL (ref 0.61–1.24)
GFR, Estimated: >60 mL/min (ref >60)
Glucose, Bld: 97 mg/dL (ref 70–99)
Potassium: 3.7 mmol/L (ref 3.5–5.1)
Sodium: 131 mmol/L — ABNORMAL LOW (ref 135–145)

## 2022-02-05 LAB — TROPONIN I (HIGH SENSITIVITY)
Troponin I (High Sensitivity): 7 ng/L (ref <18)
Troponin I (High Sensitivity): 5 ng/L (ref <18)

## 2022-02-05 LAB — BRAIN NATRIURETIC PEPTIDE: B Natriuretic Peptide: 31.1 pg/mL (ref 0.0–100.0)

## 2022-02-05 NOTE — ED Triage Notes (Signed)
Wife speaking for pt because he states he has increased SOB with speaking.  Reports congestion and non-productive cough x 3 weeks.  Multiple negative COVID test.  Seen by PCP on 2/21 and had normal chest x-ray and a CT scan that showed pneumonia.  Prescribed Z-pack without any improvements.  Increased SOB today.  Wife reports O2 sats mid 56s at home.  Denies pain.

## 2022-02-05 NOTE — ED Provider Notes (Signed)
[facility] EMERGENCY DEPARTMENT Provider Note   CSN: [id number] Arrival date & time: 02/05/22  1131     History  Chief Complaint  Patient presents with   Shortness of Breath    Jesus Mata is a 71 y.o. male with history of arthritis, vertigo, high cholesterol.  Patient presents to ED for evaluation of shortness of breath and congestion that he has been experiencing for the past 2 weeks.  Patient states that 1 month ago he began having chest congestion and cough at which time he was evaluated by his PCP.  PCP thought patient symptoms could be result of long COVID.  Patient returned to PCP 2 weeks later complaining of shortness of breath.  At this time, the PCP ordered an extensive work-up with this patient to include D-dimer as well as CT angiogram.  D-dimer was negative, CT angiogram did not show any signs of pulmonary embolism.  CT angiogram did show signs of pneumonia at which point the patient was placed on azithromycin.  Patient states that he completed the course of azithromycin however his shortness of breath persisted.  Patient was then placed on antibiotic course of cefdinir of which she has 4 doses left.  The patient also states that he has been suffering a low-grade fever for the last 1 month as well with Tmax up to 100 F.  Patient is scheduled to see cardiology tomorrow.  Patient reported to urgent care this morning due to shortness of breath as well as low oxygen saturation that was noted on patient's wife's home pulse oximeter.  The patient is endorsing shortness of breath, cough, chest congestion, fevers.  Patient denies chest pain, nausea, vomiting, diarrhea, lower extremity swelling, abdominal pain, dizziness, lightheadedness, headaches.   Shortness of Breath Associated symptoms: cough and fever   Associated symptoms: no abdominal pain, no chest pain, no headaches and no vomiting       Home Medications Prior to Admission medications   Medication Sig  Start Date End Date Taking? Authorizing Provider  atorvastatin (LIPITOR) 20 MG tablet Take 10 mg by mouth every other day. 01/10/16   [provider]  docusate sodium (COLACE) 100 MG capsule Take 1 capsule (100 mg total) by mouth 3 (three) times daily as needed. 06/27/18   Grier Mitts, PA-C  hydrochlorothiazide (HYDRODIURIL) 50 MG tablet Take 50 mg by mouth daily. 05/10/18   [provider]  methocarbamol (ROBAXIN) 500 MG tablet Take 1 tablet (500 mg total) by mouth 3 (three) times daily. 06/27/18   Grier Mitts, PA-C  Multiple Vitamins-Minerals (MULTIVITAMIN ADULT PO) Take 1 tablet by mouth daily.    [provider]  oxyCODONE-acetaminophen (PERCOCET) 5-325 MG tablet Take 1-2 tablets by mouth every 4 (four) hours as needed for severe pain. 06/27/18   Grier Mitts, PA-C      Allergies    Patient has no known allergies.    Review of Systems   Review of Systems  Constitutional:  Positive for fever.  Respiratory:  Positive for cough, chest tightness and shortness of breath.   Cardiovascular:  Negative for chest pain and leg swelling.  Gastrointestinal:  Negative for abdominal pain, diarrhea, nausea and vomiting.  Neurological:  Negative for dizziness, light-headedness and headaches.  All other systems reviewed and are negative.  Physical Exam Updated Vital Signs BP 115/82    Pulse 76    Temp 97.9 F (36.6 C) (Oral)    Resp (!) 23    SpO2 95%  Physical Exam Vitals  and nursing note reviewed.  Constitutional:      General: He is not in acute distress.    Appearance: He is not ill-appearing, toxic-appearing or diaphoretic.  HENT:     Head: Normocephalic.     Nose: Nose normal.     Mouth/Throat:     Mouth: Mucous membranes are moist.  Eyes:     Extraocular Movements: Extraocular movements intact.     Pupils: Pupils are equal, round, and reactive to light.  Neck:     Thyroid: No thyromegaly.     Vascular: No JVD.  Cardiovascular:     Rate  and Rhythm: Normal rate and regular rhythm.  Pulmonary:     Effort: Pulmonary effort is normal. No tachypnea.     Breath sounds: Normal breath sounds. No decreased breath sounds, wheezing, rhonchi or rales.  Chest:     Chest wall: No mass or tenderness.  Abdominal:     General: Bowel sounds are normal.     Palpations: Abdomen is soft. There is no mass.     Tenderness: There is no abdominal tenderness.  Musculoskeletal:     Cervical back: Normal range of motion and neck supple.     Right lower leg: No edema.     Left lower leg: No edema.  Skin:    General: Skin is warm and dry.     Capillary Refill: Capillary refill takes less than 2 seconds.  Neurological:     Mental Status: He is alert and oriented to person, place, and time.    ED Results / Procedures / Treatments   Labs (all labs ordered are listed, but only abnormal results are displayed) Labs Reviewed  BASIC METABOLIC PANEL - Abnormal; Notable for the following components:      Result Value   Sodium 131 (*)    Chloride 97 (*)    All other components within normal limits  CBC - Abnormal; Notable for the following components:   WBC 12.9 (*)    All other components within normal limits  BRAIN NATRIURETIC PEPTIDE  TROPONIN I (HIGH SENSITIVITY)  TROPONIN I (HIGH SENSITIVITY)    EKG EKG Interpretation  Date/Time:  Sunday February 05 2022 11:28:05 EST Ventricular Rate:  75 PR Interval:  136 QRS Duration: 90 QT Interval:  374 QTC Calculation: 417 R Axis:   7 Text Interpretation: Normal sinus rhythm Normal ECG When compared with ECG of 19-Jun-2018 09:29, PREVIOUS ECG IS PRESENT Confirmed by Thamas Jaegers ([id number]) on 02/05/2022 2:16:00 PM  Radiology DG Chest 2 View  Result Date: 02/05/2022 CLINICAL DATA:  Shortness of breath, cough EXAM: CHEST - 2 VIEW COMPARISON:  01/24/2022 FINDINGS: The heart size and mediastinal contours are within normal limits. Both lungs are clear. The visualized skeletal structures are  unremarkable. Left hemidiaphragm is elevated. There is previous arthroplasty in the right shoulder. IMPRESSION: No active cardiopulmonary disease. Electronically Signed   By: Elmer Picker M.D.   On: 02/05/2022 12:40    Procedures Procedures    Medications Ordered in ED Medications - No data to display  ED Course/ Medical Decision Making/ A&P                           Medical Decision Making Amount and/or Complexity of Data Reviewed Labs: ordered. Radiology: ordered.   71 year old male presents for evaluation of shortness of breath, chest congestion.  Patient has been seen for the same complaint numerous times by his PCP, has had extensive  outpatient work-up to include D-dimer and CT angiogram study.  Patient had finding of pneumonia on CT angio for which she was treated with azithromycin as well as cefdinir. Today, patient presented to ED due to shortness of breath and supposed low oxygen saturation reading on patient's home pulse oximeter. On examination, the patient is afebrile, nontachycardic, nonhypoxic with oxygen saturation of 96% on room air, clear lung sounds bilaterally, soft compressible abdomen.  The patient is nontoxic in appearance.  The patient's oxygen saturation does not decrease when he ambulates.  Patient denies feelings of orthopnea, lower extremity swelling, palpitations, lightheadedness, dizziness, weakness.  Of note, during my chart review of this patient, I noted that he at one point was taking sildenafil for "pulmonary hypertension".  I discussed this with the patient and his wife who is at the bedside, neither of them remember receiving any formal diagnosis of pulmonary hypertension for this patient.  Patient states that he at one point requested this medication "just for fun".  Patient will be worked up utilizing following labs and imaging studies interpreted by me: - CBC shows slight elevated white blood cell count of 12.9.  This is in line with patient's  baseline. - BMP unremarkable, patient slightly hyponatremic at 131 however he is able to tolerate oral fluids and can hydrate himself as an outpatient - Patient BNP 31.1, unremarkable - Patient troponin initially 7, also troponin to 5.   -Patient chest x-ray does not show any signs of cardiomegaly, effusions, consolidations, pneumothorax, widening of mediastinum  At this time, the patient is stable for discharge.  His oxygen saturation has remained stable while here in the department 95 to 97% on room air.  The patient does not desaturate when he ambulates.  Patient states that he has follow-up appointment tomorrow with cardiology, I have advised him to keep this appointment.  I have also advised the patient that he should follow-up with his PCP for further management, exploration and investigation of possible causes of his symptoms.  Today in the emergency room, he has remained largely stable.  There seems to be no emergent cause of the patient's shortness of breath.  I provided the patient with return precautions and he voices understanding.  I have discussed the patient and his case with my attending Dr. Almyra Free who agrees with plan for management.  The patient had all of his questions answered to his satisfaction.  Patient stable for discharge at this time.   Final Clinical Impression(s) / ED Diagnoses Final diagnoses:  Shortness of breath    Rx / DC Orders ED Discharge Orders     None         Lawana Chambers 02/05/22 1535    Luna Fuse, MD 02/05/22 [phone]

## 2022-02-05 NOTE — Discharge Instructions (Signed)
Return to ED with any new or worsening signs or symptoms such as chest pains, lower extremity swelling, fever Please follow-up with cardiology tomorrow as we discussed Please follow-up with your PCP, Dr. Bea Graff, to explore further causes of your shortness of breath.

## 2022-03-15 ENCOUNTER — Ambulatory Visit (AMBULATORY_SURGERY_CENTER): Payer: PPO | Admitting: *Deleted

## 2022-03-15 VITALS — Ht 70.0 in | Wt 205.0 lb

## 2022-03-15 DIAGNOSIS — Z86010 Personal history of colonic polyps: Secondary | ICD-10-CM

## 2022-03-15 NOTE — Progress Notes (Signed)

## 2022-04-28 ENCOUNTER — Encounter: Payer: Self-pay | Admitting: Gastroenterology

## 2022-05-03 ENCOUNTER — Ambulatory Visit (AMBULATORY_SURGERY_CENTER): Payer: PPO | Admitting: Gastroenterology

## 2022-05-03 ENCOUNTER — Encounter: Payer: Self-pay | Admitting: Gastroenterology

## 2022-05-03 VITALS — BP 134/80 | HR 64 | Temp 98.9°F | Resp 16 | Ht 70.0 in | Wt 205.0 lb

## 2022-05-03 DIAGNOSIS — D128 Benign neoplasm of rectum: Secondary | ICD-10-CM | POA: Diagnosis not present

## 2022-05-03 DIAGNOSIS — Z86010 Personal history of colonic polyps: Secondary | ICD-10-CM | POA: Diagnosis not present

## 2022-05-03 MED ORDER — SODIUM CHLORIDE 0.9 % IV SOLN: 500.0000 mL | Freq: Once | INTRAVENOUS | Status: DC

## 2022-05-03 NOTE — Progress Notes (Signed)
[facility] History and Physical   Primary Care Physician:  Raina Mina., MD   Reason for Procedure:   History of polyps  Plan:     colonoscopy     HPI: Jesus Mata is a 71 y.o. 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"   Vertigo    takes hydrochlorothiazide    Past Surgical History:  Procedure Laterality Date   CATARACT EXTRACTION W/ INTRAOCULAR LENS  IMPLANT, BILATERAL Bilateral    COLONOSCOPY, ESOPHAGOGASTRODUODENOSCOPY (EGD) AND ESOPHAGEAL DILATION     EYE SURGERY Left    JOINT REPLACEMENT     KNEE CARTILAGE SURGERY Right    "scope"   TOTAL SHOULDER ARTHROPLASTY Right 06/27/2018   TOTAL SHOULDER ARTHROPLASTY Right 06/27/2018   TOTAL SHOULDER ARTHROPLASTY Right 06/27/2018   Procedure: RIGHT TOTAL SHOULDER ARTHROPLASTY;  Surgeon: Tania Ade, MD;  Location: [facility];  Service: Orthopedics;  Laterality: Right;    Prior to Admission medications   Medication Sig Start Date End Date Taking? Authorizing Provider  albuterol (VENTOLIN HFA) 108 (90 Base) MCG/ACT inhaler Inhale 2 puffs into the lungs every 6 (six) hours as needed for wheezing or shortness of breath.   Yes [provider]  atorvastatin (LIPITOR) 40 MG tablet Take 1 tablet by mouth daily. 03/20/22  Yes [provider]  hydrochlorothiazide (HYDRODIURIL) 50 MG tablet Take 1 tablet by mouth daily. 02/18/18  Yes [provider]  Multiple Vitamins-Minerals (MULTIVITAMIN ADULT PO) Take 1 tablet by mouth daily.   Yes [provider]  Omega-3 1000 MG CAPS Take 1 capsule by mouth daily. 02/18/18  Yes [provider]  omeprazole (PRILOSEC) 40 MG capsule Take 40 mg by mouth daily. 04/21/22  Yes [provider]  tamsulosin (FLOMAX) 0.4 MG CAPS capsule TAKE ONE CAPSULE BY MOUTH DAILY FOR  PROSTATE 09/27/21  Yes [provider]  albuterol (PROVENTIL) (2.5 MG/3ML) 0.083% nebulizer solution SMARTSIG:3 Milliliter(s) Via Nebulizer Every 6 Hours PRN 02/22/22   [provider]  Meclizine HCl 25 MG CHEW CHEW ONE TABLET BY MOUTH EVERY 6 HOURS AS NEEDED TAKE AS NEEDED FOR DIZZINESS, NOT TO EXCEED 4 TABLETS (100 MG) PER DAY. 09/27/21   [provider]  ondansetron (ZOFRAN) 8 MG tablet TAKE ONE-HALF TABLET BY MOUTH EVERY 8 HOURS AS NEEDED FOR NAUSEA 09/27/21   [provider]  sildenafil (REVATIO) 20 MG tablet TAKE UP TO 5 TABLETS BY MOUTH 1 HOUR PRIOR TO SEX AS NEEDED. (MAX 1 DOSE PER 24 HOURS) 11/14/21   [provider]    Current Outpatient Medications  Medication Sig Dispense Refill   albuterol (VENTOLIN HFA) 108 (90 Base) MCG/ACT inhaler Inhale 2 puffs into the lungs every 6 (six) hours as needed for wheezing or shortness of breath.     atorvastatin (LIPITOR) 40 MG tablet Take 1 tablet by mouth daily.     hydrochlorothiazide (HYDRODIURIL) 50 MG tablet Take 1 tablet by mouth daily.     Multiple Vitamins-Minerals (MULTIVITAMIN ADULT PO) Take 1 tablet by mouth daily.     Omega-3 1000 MG CAPS Take 1 capsule by mouth daily.     omeprazole (PRILOSEC) 40 MG capsule Take 40 mg by mouth daily.     tamsulosin (FLOMAX) 0.4 MG CAPS capsule TAKE ONE  CAPSULE BY MOUTH DAILY FOR PROSTATE     albuterol (PROVENTIL) (2.5 MG/3ML) 0.083% nebulizer solution SMARTSIG:3 Milliliter(s) Via Nebulizer Every 6 Hours PRN     Meclizine HCl 25 MG CHEW CHEW ONE TABLET BY MOUTH EVERY 6 HOURS AS NEEDED TAKE AS NEEDED FOR DIZZINESS, NOT TO EXCEED 4 TABLETS (100 MG) PER DAY.     ondansetron (ZOFRAN) 8 MG tablet TAKE ONE-HALF TABLET BY MOUTH EVERY 8 HOURS AS NEEDED FOR NAUSEA     sildenafil (REVATIO) 20 MG tablet TAKE UP TO 5 TABLETS BY MOUTH 1 HOUR PRIOR TO SEX AS NEEDED. (MAX 1 DOSE PER 24 HOURS)     Current Facility-Administered Medications  Medication Dose Route Frequency  Provider Last Rate Last Admin   0.9 %  sodium chloride infusion  500 mL Intravenous Once Jackquline Denmark, MD        Allergies as of 05/03/2022   (No Known Allergies)    Family History  Problem Relation Age of Onset   Colon cancer Neg Hx    Colon polyps Neg Hx    Esophageal cancer Neg Hx    Stomach cancer Neg Hx    Rectal cancer Neg Hx     Social History   Socioeconomic History   Marital status: Married    Spouse name: Not on file   Number of children: Not on file   Years of education: Not on file   Highest education level: Not on file  Occupational History   Not on file  Tobacco Use   Smoking status: Former    Packs/day: 1.00    Years: 8.00    Pack years: 8.00    Types: Cigarettes    Quit date: 1980    Years since quitting: 43.4   Smokeless tobacco: Never  Vaping Use   Vaping Use: Never used  Substance and Sexual Activity   Alcohol use: Yes    Alcohol/week: 7.0 standard drinks    Types: 7 Cans of beer per week   Drug use: Never   Sexual activity: Yes  Other Topics Concern   Not on file  Social History Narrative   Not on file   Social Determinants of Health   Financial Resource Strain: Not on file  Food Insecurity: Not on file  Transportation Needs: Not on file  Physical Activity: Not on file  Stress: Not on file  Social Connections: Not on file  Intimate Partner Violence: Not on file    Review of Systems: Positive for none All other review of systems negative except as mentioned in the HPI.  Physical Exam: Vital signs in last 24 hours: '@VSRANGES'$ @   General:   Alert,  Well-developed, well-nourished, pleasant and cooperative in NAD Lungs:  Clear throughout to auscultation.   Heart:  Regular rate and rhythm; no murmurs, clicks, rubs,  or gallops. Abdomen:  Soft, nontender and nondistended. Normal bowel sounds.   Neuro/Psych:  Alert and cooperative. Normal mood and affect. A and O x 3    No significant changes were identified.  The patient  continues to be an appropriate candidate for the planned procedure and anesthesia.   Carmell Austria, MD. [facility] 05/03/2022 10:36 AM@

## 2022-05-03 NOTE — Op Note (Signed)
[facility] Patient Name: Abanoub Hanken Procedure Date: 05/03/2022 10:36 AM MRN: [id number] Endoscopist: Jackquline Denmark , MD Age: 71 Referring MD:  Date of Birth: 1951-11-28 Gender: Male Account #: [id number] Procedure:                Colonoscopy Indications:              High risk colon cancer surveillance: Personal                            history of colonic polyps Medicines:                Monitored Anesthesia Care Procedure:                Pre-Anesthesia Assessment:                           - Prior to the procedure, a History and Physical                            was performed, and patient medications and                            allergies were reviewed. The patient's tolerance of                            previous anesthesia was also reviewed. The risks                            and benefits of the procedure and the sedation                            options and risks were discussed with the patient.                            All questions were answered, and informed consent                            was obtained. Prior Anticoagulants: The patient has                            taken no previous anticoagulant or antiplatelet                            agents. ASA Grade Assessment: II - A patient with                            mild systemic disease. After reviewing the risks                            and benefits, the patient was deemed in                            satisfactory condition to undergo the procedure.  After obtaining informed consent, the colonoscope                            was passed under direct vision. Throughout the                            procedure, the patient's blood pressure, pulse, and                            oxygen saturations were monitored continuously. The                            PCF-HQ190L Colonoscope was introduced through the                            anus and advanced to the the cecum,  identified by                            appendiceal orifice and ileocecal valve. The                            colonoscopy was performed without difficulty. The                            patient tolerated the procedure well. The quality                            of the bowel preparation was good. The ileocecal                            valve, appendiceal orifice, and rectum were                            photographed. Scope In: 10:43:49 AM Scope Out: 11:01:01 AM Scope Withdrawal Time: 0 hours 13 minutes 27 seconds  Total Procedure Duration: 0 hours 17 minutes 12 seconds  Findings:                 A 6 mm polyp was found in the mid rectum. The polyp                            was sessile. The polyp was removed with a cold                            snare. Resection and retrieval were complete.                           A few rare small-mouthed diverticula were found in                            the sigmoid colon.                           Non-bleeding external and internal hemorrhoids were  found during retroflexion and during perianal exam.                            The hemorrhoids were small and Grade I (internal                            hemorrhoids that do not prolapse).                           The exam was otherwise without abnormality on                            direct and retroflexion views. Complications:            No immediate complications. Estimated Blood Loss:     Estimated blood loss: none. Impression:               - One 6 mm polyp in the mid rectum, removed with a                            cold snare. Resected and retrieved.                           - Very minimal sigmoid diverticulosis.                           - Non-bleeding external and internal hemorrhoids.                           - The examination was otherwise normal on direct                            and retroflexion views. Recommendation:           - Patient has a  contact number available for                            emergencies. The signs and symptoms of potential                            delayed complications were discussed with the                            patient. Return to normal activities tomorrow.                            Written discharge instructions were provided to the                            patient.                           - Resume previous diet.                           - Continue present medications.                           -  Await pathology results.                           - Repeat colonoscopy for surveillance based on                            pathology results.                           - The findings and recommendations were discussed                            with Thayer Headings. Jackquline Denmark, MD 05/03/2022 11:05:03 AM This report has been signed electronically.

## 2022-05-03 NOTE — Progress Notes (Signed)
Sedate, gd SR, tolerated procedure well, VSS, report to RN 

## 2022-05-03 NOTE — Progress Notes (Signed)
Called to room to assist during endoscopic procedure.  Patient ID and intended procedure confirmed with present staff. Received instructions for my participation in the procedure from the performing physician.  

## 2022-05-03 NOTE — Patient Instructions (Signed)
Handout on hemorrhoids, diverticulosis, and polyps given to patient. Await pathology results. Repeat colonoscopy for surveillance will be determined based off of pathology results.   YOU HAD AN ENDOSCOPIC PROCEDURE TODAY AT [facility]:   Refer to the procedure report that was given to you for any specific questions about what was found during the examination.  If the procedure report does not answer your questions, please call your gastroenterologist to clarify.  If you requested that your care partner not be given the details of your procedure findings, then the procedure report has been included in a sealed envelope for you to review at your convenience later.  YOU SHOULD EXPECT: Some feelings of bloating in the abdomen. Passage of more gas than usual.  Walking can help get rid of the air that was put into your GI tract during the procedure and reduce the bloating. If you had a lower endoscopy (such as a colonoscopy or flexible sigmoidoscopy) you may notice spotting of blood in your stool or on the toilet paper. If you underwent a bowel prep for your procedure, you may not have a normal bowel movement for a few days.  Please Note:  You might notice some irritation and congestion in your nose or some drainage.  This is from the oxygen used during your procedure.  There is no need for concern and it should clear up in a day or so.  SYMPTOMS TO REPORT IMMEDIATELY:  Following lower endoscopy (colonoscopy or flexible sigmoidoscopy):  Excessive amounts of blood in the stool  Significant tenderness or worsening of abdominal pains  Swelling of the abdomen that is new, acute  Fever of 100F or higher  For urgent or emergent issues, a gastroenterologist can be reached at any hour by calling [phone]. Do not use MyChart messaging for urgent concerns.    DIET:  We do recommend a small meal at first, but then you may proceed to your regular diet.  Drink plenty of fluids but you  should avoid alcoholic beverages for 24 hours.  ACTIVITY:  You should plan to take it easy for the rest of today and you should NOT DRIVE or use heavy machinery until tomorrow (because of the sedation medicines used during the test).    FOLLOW UP: Our staff will call the number listed on your records 48-72 hours following your procedure to check on you and address any questions or concerns that you may have regarding the information given to you following your procedure. If we do not reach you, we will leave a message.  We will attempt to reach you two times.  During this call, we will ask if you have developed any symptoms of COVID 19. If you develop any symptoms (ie: fever, flu-like symptoms, shortness of breath, cough etc.) before then, please call [phone].  If you test positive for Covid 19 in the 2 weeks post procedure, please call and report this information to [place].    If any biopsies were taken you will be contacted by phone or by letter within the next 1-3 weeks.  Please call us at [phone] if you have not heard about the biopsies in 3 weeks.    SIGNATURES/CONFIDENTIALITY: You and/or your care partner have signed paperwork which will be entered into your electronic medical record.  These signatures attest to the fact that that the information above on your After Visit Summary has been reviewed and is understood.  Full responsibility of the confidentiality of this discharge information  lies with you and/or your care-partner.  

## 2022-05-03 NOTE — Progress Notes (Signed)
VS by DT  Pt's states no medical or surgical changes since previsit or office visit.  

## 2022-05-04 ENCOUNTER — Telehealth: Payer: Self-pay

## 2022-05-04 NOTE — Telephone Encounter (Signed)
  Follow up Call-     05/03/2022   10:13 AM  Call back number  Post procedure Call Back phone  # [phone]  Permission to leave phone message Yes     Patient questions:  Do you have a fever, pain , or abdominal swelling? No. Pain Score  0 *  Have you tolerated food without any problems? Yes.    Have you been able to return to your normal activities? Yes.    Do you have any questions about your discharge instructions: Diet   No. Medications  No. Follow up visit  No.  Do you have questions or concerns about your Care? No.  Actions: * If pain score is 4 or above: No action needed, pain <4.

## 2022-05-05 ENCOUNTER — Encounter: Payer: Self-pay | Admitting: Gastroenterology

## 2023-05-29 ENCOUNTER — Encounter: Payer: Self-pay | Admitting: Gastroenterology

## 2023-05-29 ENCOUNTER — Ambulatory Visit: Payer: PPO | Admitting: Gastroenterology

## 2023-05-29 VITALS — BP 124/78 | HR 81 | Ht 70.0 in | Wt 213.2 lb

## 2023-05-29 DIAGNOSIS — R1310 Dysphagia, unspecified: Secondary | ICD-10-CM

## 2023-05-29 DIAGNOSIS — K219 Gastro-esophageal reflux disease without esophagitis: Secondary | ICD-10-CM | POA: Diagnosis not present

## 2023-05-29 DIAGNOSIS — R490 Dysphonia: Secondary | ICD-10-CM

## 2023-05-29 MED ORDER — FAMOTIDINE 20 MG PO TABS: 20.0000 mg | ORAL_TABLET | Freq: Every day | ORAL | 3 refills | Status: AC

## 2023-05-29 MED ORDER — PANTOPRAZOLE SODIUM 40 MG PO TBEC: 40.0000 mg | DELAYED_RELEASE_TABLET | Freq: Two times a day (BID) | ORAL | 3 refills | Status: DC

## 2023-05-29 NOTE — Progress Notes (Signed)
Chief Complaint: GERD  Referring Provider:  Gordan Payment., MD      ASSESSMENT AND PLAN;   #1. GERD  #2. H/O progressive hoarseness with ?dysphagia/postprandial cough  Plan: -Change omeprazole to protonix 40mg  po BID #90 -Add pepcid 20mg  po at bedtime #90 -Ba swallow with tab -EGD with dil -He has FU with pulm/ENT for further WU -Voice clinic at [facility] or [facility] if continued problems.   HPI:    Jesus Mata is a 72 y.o. male  With HTN, Mnire's disease, OA, history of Lyme's disease, HLD, COPD with previous history of smoking  C/O progressive hoarseness with occ solid food dysphagia without heartburn. 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 Also seen by pulmonary-CT chest showing groundglass appearance.  He is currently being worked up for post pneumonia pulmonary changes vs ILD.  Also been diagnosed with postnasal drip.  After ENT visit, omeprazole has been increased to 40 BID No improvement in cough or hoarseness.  Further pulmonary workup which is pending -CT sinuses -PFTs -Sleep study for OSA  Here for GI evaluation.  Currently denies having any heartburn, nausea or vomiting.  No regurgitation.  His brother-in-law had Zenker's diverticulum.  Patient very much concerned regarding the same.  Denies having any melena, hematochezia, diarrhea or constipation.    -former smoker. He started smoking around age 67 (37). He stopped smoking in 1978. -Negative allergy testing. -Given trial of nasal Flonase without any benefit. -H/O pneumonia 2023   Previous GI workup:  Colonoscopy 05/03/2022: -Colonic polyp s/p polypectomy. Bx- TA -Minimal sigmoid diverticulosis -Repeat in 7 years  EGD 03/2017 -Minimal gastritis. +HP.  Treated with Biaxin/Metro/omeprazole x 14 days -Otherwise normal EGD. -Neg small bowel biopsies for celiac. Past Medical History:  Diagnosis Date   Anemia    Arthritis    "right shoulder" (06/27/2018)   Dysuria    H. 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 Otherwise normal EGD   EYE SURGERY Left    JOINT REPLACEMENT     KNEE CARTILAGE SURGERY Right    "scope"   TOTAL SHOULDER ARTHROPLASTY Right 06/27/2018   TOTAL SHOULDER ARTHROPLASTY Right 06/27/2018   TOTAL SHOULDER ARTHROPLASTY Right 06/27/2018   Procedure: RIGHT TOTAL SHOULDER ARTHROPLASTY;  Surgeon: Jones Broom, MD;  Location: [facility] OR;  Service: Orthopedics;  Laterality: Right;    Family History  Problem Relation Age of Onset   COPD Brother    Colon cancer Neg Hx    Colon polyps Neg Hx    Esophageal cancer Neg Hx    Stomach cancer Neg Hx    Rectal cancer Neg Hx     Social History   Tobacco Use   Smoking status: Former    Packs/day: 1.00    Years: 8.00    Additional pack years: 0.00    Total pack years: 8.00    Types: Cigarettes    Quit date: 1980    Years since quitting: 44.4   Smokeless tobacco: Never  Vaping Use   Vaping Use: Never used  Substance Use Topics   Alcohol use: Yes    Alcohol/week: 7.0 standard drinks of alcohol    Types: 7 Cans of beer per week   Drug use: Never    Current Outpatient Medications  Medication Sig Dispense Refill   albuterol (PROVENTIL) (2.5 MG/3ML) 0.083% nebulizer solution SMARTSIG:3 Milliliter(s) Via Nebulizer Every 6 Hours PRN     albuterol (VENTOLIN HFA) 108 (90 Base) MCG/ACT inhaler Inhale 2 puffs into the lungs every 6 (six) hours as needed for wheezing or shortness of breath.     atorvastatin (LIPITOR) 40 MG tablet Take 1 tablet by mouth daily.     fluticasone (FLONASE) 50  MCG/ACT nasal spray Place into both nostrils daily.     hydrochlorothiazide (HYDRODIURIL) 50 MG tablet Take 1 tablet by mouth daily.     Meclizine HCl 25 MG CHEW CHEW ONE TABLET BY MOUTH EVERY 6 HOURS AS NEEDED TAKE AS NEEDED FOR DIZZINESS, NOT TO EXCEED 4 TABLETS (100 MG) PER DAY.     Multiple Vitamins-Minerals (MULTIVITAMIN ADULT PO) Take 1 tablet by mouth daily.     Omega-3 1000 MG CAPS Take 1 capsule by mouth daily.     omeprazole (PRILOSEC) 40 MG capsule Take 40 mg by mouth daily.     ondansetron (ZOFRAN) 8 MG tablet TAKE ONE-HALF TABLET BY MOUTH EVERY 8 HOURS AS NEEDED FOR NAUSEA     sildenafil (REVATIO) 20 MG tablet TAKE UP TO 5 TABLETS BY MOUTH 1 HOUR PRIOR TO SEX AS NEEDED. (MAX 1 DOSE PER 24 HOURS)     tamsulosin (FLOMAX) 0.4 MG CAPS capsule TAKE ONE CAPSULE BY MOUTH DAILY FOR PROSTATE     No current facility-administered medications for this visit.    No Known Allergies  Review of Systems:  Constitutional: Denies fever, chills, diaphoresis, appetite change and has fatigue.  HEENT: neg - hoarseness Respiratory: Has SOB, DOE, cough, No chest tightness,  and wheezing.   Cardiovascular: Denies chest pain, palpitations and leg swelling.  Genitourinary: Denies dysuria, urgency, frequency, hematuria, flank pain and difficulty urinating.  Musculoskeletal: Denies myalgias, back pain, joint swelling, arthralgias and gait problem.  Skin: No rash.  Neurological: Denies dizziness, seizures, syncope, weakness, light-headedness, numbness and headaches.  Hematological: Denies adenopathy. Easy bruising, personal or family bleeding history  Psychiatric/Behavioral: No anxiety or depression     Physical Exam:    BP 124/78   Pulse 81   Ht 5\' 10"  (1.778 m)   Wt 213 lb 3.2 oz (96.7 kg)   SpO2 97%   BMI 30.59 kg/m  Wt Readings from Last 3 Encounters:  05/29/23 213 lb 3.2 oz (96.7 kg)  05/03/22 205 lb (93 kg)  03/15/22 205 lb (93 kg)   Constitutional:  Well-developed, in no acute  distress. Psychiatric: Normal mood and affect. Behavior is normal. HEENT: Pupils normal.  Conjunctivae are normal. No scleral icterus. Cardiovascular: Normal rate, regular rhythm. No edema Pulmonary/chest: Effort normal and breath sounds normal. No wheezing, rales or rhonchi. Abdominal: Soft, nondistended. Nontender. Bowel sounds active throughout. There are no masses palpable. No hepatomegaly. Rectal: Deferred Neurological: Alert and oriented to person place and time. Skin: Skin is warm and dry. No rashes noted.  Data Reviewed: I have personally reviewed following labs and imaging studies  CBC:    Latest Ref Rng & Units 02/05/2022   11:56 AM 06/28/2018    4:17 AM 06/19/2018    9:12 AM  CBC  WBC 4.0 - 10.5 K/uL 12.9  11.2  6.4   Hemoglobin 13.0 -  17.0 g/dL 08.6  57.8  46.9   Hematocrit 39.0 - 52.0 % 39.4  34.1  40.7   Platelets 150 - 400 K/uL 291  221  241     CMP:    Latest Ref Rng & Units 02/05/2022   11:56 AM 06/28/2018    4:17 AM 06/19/2018    9:12 AM  CMP  Glucose 70 - 99 mg/dL 97  629  528   BUN 8 - 23 mg/dL 12  22  17    Creatinine 0.61 - 1.24 mg/dL 4.13  2.44  0.10   Sodium 135 - 145 mmol/L 131  137  138   Potassium 3.5 - 5.1 mmol/L 3.7  4.0  3.4   Chloride 98 - 111 mmol/L 97  107  103   CO2 22 - 32 mmol/L 24  22  25    Calcium 8.9 - 10.3 mg/dL 9.0  8.0  9.1   Total Protein 6.5 - 8.1 g/dL   7.1   Total Bilirubin 0.3 - 1.2 mg/dL   0.9   Alkaline Phos 38 - 126 U/L   50   AST 15 - 41 U/L   28   ALT 0 - 44 U/L   20         Edman Circle, MD 05/29/2023, 11:28 AM  Cc: Gordan Payment., MD

## 2023-05-29 NOTE — Patient Instructions (Addendum)
_______________________________________________________  If your blood pressure at your visit was 140/90 or greater, please contact your primary care physician to follow up on this.  _______________________________________________________  If you are age 73 or older, your body mass index should be between 23-30. Your Body mass index is 30.59 kg/m. If this is out of the aforementioned range listed, please consider follow up with your Primary Care Provider.  If you are age 77 or younger, your body mass index should be between 19-25. Your Body mass index is 30.59 kg/m. If this is out of the aformentioned range listed, please consider follow up with your Primary Care Provider.   ________________________________________________________  The [facility] providers would like to encourage you to use [facility] to communicate with providers for non-urgent requests or questions.  Due to long hold times on the telephone, sending your provider a message by [facility] may be a faster and more efficient way to get a response.  Please allow 48 business hours for a response.  Please remember that this is for non-urgent requests.  _______________________________________________________  Jesus Mata have been scheduled for an endoscopy. Please follow written instructions given to you at your visit today. If you use inhalers (even only as needed), please bring them with you on the day of your procedure.  We have sent the following medications to your pharmacy for you to pick up at your convenience: Protonix 2 times a day stop omeprazole Pepcid at night  You have been scheduled for a Barium Esophogram at [facility] on 06-06-2023 at 10am. Please arrive 30 minutes prior to your appointment for registration. Make certain not to have anything to eat or drink 3 hours prior to your test. If you need to reschedule for any reason, please contact radiology at [phone] to do  so. __________________________________________________________________ A barium swallow is an examination that concentrates on views of the esophagus. This tends to be a double contrast exam (barium and two liquids which, when combined, create a gas to distend the wall of the oesophagus) or single contrast (non-ionic iodine based). The study is usually tailored to your symptoms so a good history is essential. Attention is paid during the study to the form, structure and configuration of the esophagus, looking for functional disorders (such as aspiration, dysphagia, achalasia, motility and reflux) EXAMINATION You may be asked to change into a gown, depending on the type of swallow being performed. A radiologist and radiographer will perform the procedure. The radiologist will advise you of the type of contrast selected for your procedure and direct you during the exam. You will be asked to stand, sit or lie in several different positions and to hold a small amount of fluid in your mouth before being asked to swallow while the imaging is performed .In some instances you may be asked to swallow barium coated marshmallows to assess the motility of a solid food bolus. The exam can be recorded as a digital or video fluoroscopy procedure. POST PROCEDURE It will take 1-2 days for the barium to pass through your system. To facilitate this, it is important, unless otherwise directed, to increase your fluids for the next 24-48hrs and to resume your normal diet.  This test typically takes about 30 minutes to perform. __________________________________________________________________________________  Thank you,  Dr. Lynann Bologna

## 2023-06-06 ENCOUNTER — Ambulatory Visit (HOSPITAL_COMMUNITY)
Admission: RE | Admit: 2023-06-06 | Discharge: 2023-06-06 | Disposition: A | Discharge status: Home / Self Care | Payer: PPO | Source: Ambulatory Visit | Attending: Gastroenterology | Admitting: Gastroenterology

## 2023-06-06 DIAGNOSIS — R1310 Dysphagia, unspecified: Secondary | ICD-10-CM | POA: Diagnosis present

## 2023-06-06 DIAGNOSIS — K219 Gastro-esophageal reflux disease without esophagitis: Secondary | ICD-10-CM | POA: Insufficient documentation

## 2023-06-06 DIAGNOSIS — R490 Dysphonia: Secondary | ICD-10-CM | POA: Insufficient documentation

## 2023-06-29 ENCOUNTER — Encounter: Payer: Self-pay | Admitting: Gastroenterology

## 2023-06-29 ENCOUNTER — Ambulatory Visit (AMBULATORY_SURGERY_CENTER): Payer: PPO | Admitting: Gastroenterology

## 2023-06-29 VITALS — BP 121/74 | HR 96 | Temp 98.6°F | Resp 15 | Ht 70.0 in | Wt 213.0 lb

## 2023-06-29 DIAGNOSIS — R1310 Dysphagia, unspecified: Secondary | ICD-10-CM | POA: Diagnosis not present

## 2023-06-29 DIAGNOSIS — K219 Gastro-esophageal reflux disease without esophagitis: Secondary | ICD-10-CM | POA: Diagnosis not present

## 2023-06-29 DIAGNOSIS — K31A19 Gastric intestinal metaplasia without dysplasia, unspecified site: Secondary | ICD-10-CM

## 2023-06-29 DIAGNOSIS — K2950 Unspecified chronic gastritis without bleeding: Secondary | ICD-10-CM

## 2023-06-29 MED ORDER — SODIUM CHLORIDE 0.9 % IV SOLN: 500.0000 mL | Freq: Once | INTRAVENOUS | Status: AC

## 2023-06-29 NOTE — Progress Notes (Signed)
Called to room to assist during endoscopic procedure.  Patient ID and intended procedure confirmed with present staff. Received instructions for my participation in the procedure from the performing physician.  

## 2023-06-29 NOTE — Progress Notes (Signed)
Chief Complaint: GERD  Referring Provider:  Gordan Payment., MD      ASSESSMENT AND PLAN;   #1. GERD  #2. H/O progressive hoarseness with ?dysphagia/postprandial cough  Plan: -Change omeprazole to protonix 40mg  po BID #90 -Add pepcid 20mg  po at bedtime #90 -Ba swallow with tab -EGD with dil -He has FU with pulm/ENT for further WU -Voice clinic at [facility] or [facility] if continued problems.   Ba Swallow: 05/2023 1. There is mild irregularity of the mucosa anteriorly just below the level of the cricopharyngeus. Findings may represent focal esophagitis; however, further evaluation with endoscopy is recommended to exclude underlying neoplastic process. There is no proximal dilation. 2. Penetration of thin barium while patient was in supine RAO position. No aspiration. 3. Mild esophageal dysmotility.   For EGD today.  HPI:    Jesus Mata is a 72 y.o. male  With HTN, Mnire's disease, OA, history of Lyme's disease, HLD, COPD with previous history of smoking  C/O progressive hoarseness with occ solid food dysphagia without heartburn. 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 Also seen by pulmonary-CT chest showing groundglass appearance.  He is currently being worked up for post pneumonia pulmonary changes vs ILD.  Also been diagnosed with postnasal drip.  After ENT visit, omeprazole has been increased to 40 BID No improvement in cough or hoarseness.  Further pulmonary workup which is pending -CT sinuses -PFTs -Sleep study for OSA  Here for GI evaluation.  Currently denies having any heartburn, nausea or vomiting.  No regurgitation.  His brother-in-law had Zenker's diverticulum.  Patient very much concerned regarding the same.  Denies having any melena, hematochezia, diarrhea or constipation.    -former smoker. He started smoking around age 63 (54). He stopped smoking in 1978. -Negative allergy testing. -Given trial of nasal Flonase without any benefit. -H/O  pneumonia 2023   Previous GI workup:  Colonoscopy 05/03/2022: -Colonic polyp s/p polypectomy. Bx- TA -Minimal sigmoid diverticulosis -Repeat in 7 years  EGD 03/2017 -Minimal gastritis. +HP.  Treated with Biaxin/Metro/omeprazole x 14 days -Otherwise normal EGD. -Neg small bowel biopsies for celiac. Past Medical History:  Diagnosis Date   Anemia    Arthritis    "right shoulder" (06/27/2018)   Dysuria    GERD (gastroesophageal reflux disease)    H. 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 Otherwise normal EGD   EYE SURGERY Left    JOINT REPLACEMENT     KNEE CARTILAGE SURGERY Right    "scope"   TOTAL SHOULDER ARTHROPLASTY Right 06/27/2018   TOTAL SHOULDER ARTHROPLASTY Right 06/27/2018   TOTAL SHOULDER ARTHROPLASTY Right 06/27/2018   Procedure: RIGHT TOTAL SHOULDER ARTHROPLASTY;  Surgeon: Jones Broom, MD;  Location: [facility] OR;  Service: Orthopedics;  Laterality: Right;   UPPER GASTROINTESTINAL ENDOSCOPY      Family History  Problem Relation Age of Onset   COPD Brother    Colon cancer Neg Hx    Colon polyps Neg Hx    Esophageal cancer Neg Hx  Stomach cancer Neg Hx    Rectal cancer Neg Hx     Social History   Tobacco Use   Smoking status: Former    Current packs/day: 0.00    Average packs/day: 1 pack/day for 8.0 years (8.0 ttl pk-yrs)    Types: Cigarettes    Start date: 10    Quit date: 1980    Years since quitting: 44.5   Smokeless tobacco: Never  Vaping Use   Vaping status: Never Used  Substance Use Topics   Alcohol  use: Yes    Alcohol/week: 7.0 standard drinks of alcohol    Types: 7 Cans of beer per week   Drug use: Never    Current Outpatient Medications  Medication Sig Dispense Refill   albuterol (VENTOLIN HFA) 108 (90 Base) MCG/ACT inhaler Inhale 2 puffs into the lungs every 6 (six) hours as needed for wheezing or shortness of breath.     atorvastatin (LIPITOR) 40 MG tablet Take 1 tablet by mouth daily.     famotidine (PEPCID) 20 MG tablet Take 1 tablet (20 mg total) by mouth at bedtime. 90 tablet 3   fluticasone (FLONASE) 50 MCG/ACT nasal spray Place into both nostrils daily.     hydrochlorothiazide (HYDRODIURIL) 50 MG tablet Take 1 tablet by mouth daily.     Multiple Vitamins-Minerals (MULTIVITAMIN ADULT PO) Take 1 tablet by mouth daily.     Omega-3 1000 MG CAPS Take 1 capsule by mouth daily.     pantoprazole (PROTONIX) 40 MG tablet Take 1 tablet (40 mg total) by mouth 2 (two) times daily. 180 tablet 3   sildenafil (REVATIO) 20 MG tablet TAKE UP TO 5 TABLETS BY MOUTH 1 HOUR PRIOR TO SEX AS NEEDED. (MAX 1 DOSE PER 24 HOURS)     tamsulosin (FLOMAX) 0.4 MG CAPS capsule TAKE ONE CAPSULE BY MOUTH DAILY FOR PROSTATE     albuterol (PROVENTIL) (2.5 MG/3ML) 0.083% nebulizer solution SMARTSIG:3 Milliliter(s) Via Nebulizer Every 6 Hours PRN     Meclizine HCl 25 MG CHEW CHEW ONE TABLET BY MOUTH EVERY 6 HOURS AS NEEDED TAKE AS NEEDED FOR DIZZINESS, NOT TO EXCEED 4 TABLETS (100 MG) PER DAY.     ondansetron (ZOFRAN) 8 MG tablet TAKE ONE-HALF TABLET BY MOUTH EVERY 8 HOURS AS NEEDED FOR NAUSEA (Patient not taking: Reported on 06/29/2023)     Current Facility-Administered Medications  Medication Dose Route Frequency Provider Last Rate Last Admin   0.9 %  sodium chloride infusion  500 mL Intravenous Once Lynann Bologna, MD        No Known Allergies  Review of Systems:  Constitutional: Denies fever, chills, diaphoresis, appetite change and has fatigue.  HEENT: neg - hoarseness Respiratory: Has SOB, DOE, cough,  No chest tightness,  and wheezing.   Cardiovascular: Denies chest pain, palpitations and leg swelling.  Genitourinary: Denies dysuria, urgency, frequency, hematuria, flank pain and difficulty urinating.  Musculoskeletal: Denies myalgias, back pain, joint swelling, arthralgias and gait problem.  Skin: No rash.  Neurological: Denies dizziness, seizures, syncope, weakness, light-headedness, numbness and headaches.  Hematological: Denies adenopathy. Easy bruising, personal or family bleeding history  Psychiatric/Behavioral: No anxiety or depression     Physical Exam:    BP 137/86   Pulse 96   Temp 98.6 F (37 C)   Ht 5\' 10"  (1.778 m)   Wt 213 lb (96.6 kg)   SpO2 96%   BMI 30.56 kg/m  Wt Readings from Last 3 Encounters:  06/29/23 213 lb (96.6 kg)  05/29/23 213  lb 3.2 oz (96.7 kg)  05/03/22 205 lb (93 kg)   Constitutional:  Well-developed, in no acute distress. Psychiatric: Normal mood and affect. Behavior is normal. HEENT: Pupils normal.  Conjunctivae are normal. No scleral icterus. Cardiovascular: Normal rate, regular rhythm. No edema Pulmonary/chest: Effort normal and breath sounds normal. No wheezing, rales or rhonchi. Abdominal: Soft, nondistended. Nontender. Bowel sounds active throughout. There are no masses palpable. No hepatomegaly. Rectal: Deferred Neurological: Alert and oriented to person place and time. Skin: Skin is warm and dry. No rashes noted.  Data Reviewed: I have personally reviewed following labs and imaging studies  CBC:    Latest Ref Rng & Units 02/05/2022   11:56 AM 06/28/2018    4:17 AM 06/19/2018    9:12 AM  CBC  WBC 4.0 - 10.5 K/uL 12.9  11.2  6.4   Hemoglobin 13.0 - 17.0 g/dL 53.6  64.4  03.4   Hematocrit 39.0 - 52.0 % 39.4  34.1  40.7   Platelets 150 - 400 K/uL 291  221  241     CMP:    Latest Ref Rng & Units 02/05/2022   11:56 AM 06/28/2018    4:17 AM 06/19/2018    9:12 AM  CMP  Glucose 70 - 99 mg/dL 97  742  595   BUN 8 - 23 mg/dL 12  22   17    Creatinine 0.61 - 1.24 mg/dL 6.38  7.56  4.33   Sodium 135 - 145 mmol/L 131  137  138   Potassium 3.5 - 5.1 mmol/L 3.7  4.0  3.4   Chloride 98 - 111 mmol/L 97  107  103   CO2 22 - 32 mmol/L 24  22  25    Calcium 8.9 - 10.3 mg/dL 9.0  8.0  9.1   Total Protein 6.5 - 8.1 g/dL   7.1   Total Bilirubin 0.3 - 1.2 mg/dL   0.9   Alkaline Phos 38 - 126 U/L   50   AST 15 - 41 U/L   28   ALT 0 - 44 U/L   20         Edman Circle, MD 06/29/2023, 11:21 AM  Cc: Gordan Payment., MD

## 2023-07-02 ENCOUNTER — Telehealth: Payer: Self-pay | Admitting: *Deleted

## 2023-07-02 NOTE — Telephone Encounter (Signed)
  Follow up Call-     06/29/2023   10:38 AM 05/03/2022   10:13 AM  Call back number  Post procedure Call Back phone  # [phone]  Permission to leave phone message Yes Yes     Patient questions:  Do you have a fever, pain , or abdominal swelling? No. Pain Score  0 *  Have you tolerated food without any problems? Yes.    Have you been able to return to your normal activities? Yes.    Do you have any questions about your discharge instructions: Diet   No. Medications  No. Follow up visit  No.  Do you have questions or concerns about your Care? No.  Actions: * If pain score is 4 or above: No action needed, pain <4.

## 2023-07-12 ENCOUNTER — Encounter: Payer: Self-pay | Admitting: Gastroenterology

## 2023-09-14 ENCOUNTER — Encounter: Payer: Self-pay | Admitting: Internal Medicine

## 2023-09-14 ENCOUNTER — Ambulatory Visit (INDEPENDENT_AMBULATORY_CARE_PROVIDER_SITE_OTHER): Payer: No Typology Code available for payment source | Admitting: Internal Medicine

## 2023-09-14 VITALS — BP 126/83 | HR 78 | Ht 69.0 in | Wt 210.0 lb

## 2023-09-14 DIAGNOSIS — G4733 Obstructive sleep apnea (adult) (pediatric): Secondary | ICD-10-CM

## 2023-09-14 DIAGNOSIS — Z23 Encounter for immunization: Secondary | ICD-10-CM | POA: Diagnosis not present

## 2023-09-14 DIAGNOSIS — R0602 Shortness of breath: Secondary | ICD-10-CM

## 2023-09-14 NOTE — Addendum Note (Signed)
Addended by: Delrae Rend on: 09/14/2023 04:48 PM   Modules accepted: Orders

## 2023-09-14 NOTE — Patient Instructions (Addendum)
It was a pleasure to see you today!  Please schedule follow up scheduled with myself in 3 months.  If my schedule is not open yet, we will contact you with a reminder closer to that time. Please call [phone] if you haven't heard from [place] a month before, and always call us sooner if issues or concerns arise. You can also send [place] a message through MyChart, but but aware that this is not to be used for urgent issues and it may take up to 5-7 days to receive a reply. Please be aware that you will likely be able to view your results before I have a chance to respond to them. Please give [place] 5 business days to respond to any non-urgent results.   Before your next visit I would like you to have:  Full set of PFTs, follow up afterwards  We will get some breathing testing to better understand your lung function.  Keep your oxygen levels over 90% and continue to wear oxygen at night [facility].  Follow up at the [facility] for your sleep study.  [facility] reviewed [facility] and it was very reassuring.  I do recommend the RSV and covid shot from drug store - we did your flu shot today.

## 2023-09-14 NOTE — Progress Notes (Signed)
Jesus Mata    [id number]    Sep 13, 1951  Primary Care Physician:Grisso, Evlyn Courier., MD  Referring Physician: Alver Fisher, MD [address] [place],  [place] [address] Reason for Consultation: shortness of breath Date of Consultation: 09/14/2023  Chief complaint:   Chief Complaint  Patient presents with   Follow-up    Sob ( 1 year ) pt is on oxygen at night. Pt is not using his inhaler. Pt did have glass opacity on ct scan      HPI: Jesus Mata is a 72 y.o. man who presents for new patient evaluation for shortness of breath with exertion. He is here as a referral from the [place]. He has slowed down for the past 2 years. He was diagnosed with pneumonia in 2023. This was followed by subsequent abnormal CT Chest with progressive symptoms and multiple rounds of antibiotics. Overall now he feels well - his wife thinks he has improved over the past year. He is here based on a [place] referral.   Has also seen Dr. Chales Abrahams for GERD and is on acid reflux medication.  He had a sleep study - was shown to have overnight hypoxemia and is now on oxygen. Split night study is pending in lab.   Ambulatory desat study showed hypoxemia down to 85% on test - here he desaturated to 94% with ambulation.   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  Exposures: live at home with his wife.  Smoking history: see below. Also passive smoke exposure in childhood.  Social History   Occupational History   Not on file  Tobacco Use   Smoking status: Former    Current packs/day: 0.00    Average packs/day: 1 pack/day for 8.0 years (8.0 ttl pk-yrs)    Types: Cigarettes    Start date: 26    Quit date: 1980    Years since quitting: 44.7   Smokeless tobacco: Never  Vaping Use   Vaping status: Never Used  Substance and Sexual Activity   Alcohol use: Yes    Alcohol/week: 7.0 standard drinks of alcohol    Types: 7 Cans of beer  per week   Drug use: Never   Sexual activity: Yes    Relevant family history:  Family History  Problem Relation Age of Onset   COPD Brother    Colon cancer Neg Hx    Colon polyps Neg Hx    Esophageal cancer Neg Hx    Stomach cancer Neg Hx    Rectal cancer Neg Hx     Past Medical History:  Diagnosis Date   Anemia    Arthritis    "right shoulder" (06/27/2018)   Dysuria    GERD (gastroesophageal reflux disease)    H. 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 Otherwise normal EGD   EYE SURGERY Left    JOINT REPLACEMENT  KNEE CARTILAGE SURGERY Right    "scope"   TOTAL SHOULDER ARTHROPLASTY Right 06/27/2018   TOTAL SHOULDER ARTHROPLASTY Right 06/27/2018   TOTAL SHOULDER ARTHROPLASTY Right 06/27/2018   Procedure: RIGHT TOTAL SHOULDER ARTHROPLASTY;  Surgeon: Jones Broom, MD;  Location: [facility] OR;  Service: Orthopedics;  Laterality: Right;   UPPER GASTROINTESTINAL ENDOSCOPY       Physical Exam: Blood pressure 126/83, pulse 78, height 5\' 9"  (1.753 m), weight 210 lb (95.3 kg), SpO2 96%. Gen:      No acute distress ENT:  no nasal polyps, mucus membranes moist Lungs:    No increased respiratory effort, symmetric chest wall excursion, clear to auscultation bilaterally, no wheezes or crackles CV:         Regular rate and rhythm; no murmurs, rubs, or gallops.  No pedal edema Abd:      + bowel sounds; soft, non-tender; no distension MSK: no acute synovitis of DIP or PIP joints, no mechanics hands.  Skin:      Warm and dry; no rashes Neuro:  normal speech, no focal facial asymmetry Psych: alert and oriented x3, normal mood and affect   Data Reviewed/Medical Decision Making:  Independent interpretation of tests: Imaging:  Review of patient's CT Chest July 2023 images revealed mild peripheral patchy ground glass with mild peribronchial cuffing indicative of small airways disease, or mild inflammation . The patient's images have been independently reviewed by me.    PFTs:  Labs:  Lab Results  Component Value Date   NA 131 (L) 02/05/2022   K 3.7 02/05/2022   CO2 24 02/05/2022   GLUCOSE 97 02/05/2022   BUN 12 02/05/2022   CREATININE 1.11 02/05/2022   CALCIUM 9.0 02/05/2022   GFRNONAA >60 02/05/2022   Lab Results  Component Value Date   WBC 12.9 (H) 02/05/2022   HGB 14.0 02/05/2022   HCT 39.4 02/05/2022   MCV 88.9 02/05/2022   PLT 291 02/05/2022     Immunization status:  Immunization History  Administered Date(s) Administered   [employer] Vaccination 01/23/2020, 02/20/2020     I reviewed prior external note(s) from pulmonary, ED  I reviewed the result(s) of the labs and imaging as noted above.   I have ordered pft  Assessment:  Dyspnea on exertion OSA Passive smoke exposure with remote mild smoking history Chronic respiratory failure - on 1LNC nocturnally Need for influenza vaccination  Plan/Recommendations: Overall very low suspicion for ILD given reassuring CT scan, absence of crackles and no ambulatory desaturation. Symptoms are minimal. I suspect untreated OSA playing a role.   We will get some breathing testing to better understand your lung function.  Keep your oxygen levels over 90% and continue to wear oxygen at night [facility].  [facility] up at the [facility] for your sleep study.  [facility] and [facility].  I do recommend the RSV and covid shot from drug store - we did your flu shot today.    We  discussed disease management and progression at length today.    Return to Care: Return in about 2 months (around 11/14/2023) for next available PFT, follow up after.  Durel Salts, MD Pulmonary and Critical Care Medicine [facility] Office:[phone]  CC: Briones, Leisa Lenz, MD

## 2023-09-20 ENCOUNTER — Ambulatory Visit: Payer: No Typology Code available for payment source | Admitting: Allergy and Immunology

## 2023-09-24 ENCOUNTER — Ambulatory Visit (INDEPENDENT_AMBULATORY_CARE_PROVIDER_SITE_OTHER): Payer: No Typology Code available for payment source | Admitting: Allergy and Immunology

## 2023-09-24 ENCOUNTER — Encounter: Payer: Self-pay | Admitting: Allergy and Immunology

## 2023-09-24 VITALS — BP 118/68 | HR 79 | Temp 98.4°F | Ht 68.5 in | Wt 214.0 lb

## 2023-09-24 DIAGNOSIS — K219 Gastro-esophageal reflux disease without esophagitis: Secondary | ICD-10-CM

## 2023-09-24 DIAGNOSIS — J301 Allergic rhinitis due to pollen: Secondary | ICD-10-CM | POA: Diagnosis not present

## 2023-09-24 MED ORDER — AZELASTINE HCL 0.1 % NA SOLN: 1.0000 | Freq: Two times a day (BID) | NASAL | 3 refills | Status: DC | PRN

## 2023-09-24 NOTE — Patient Instructions (Addendum)
  1. Allergen avoidance measures  2.  Treat and prevent inflammation of airway:   A. Flonase - 1 spray each nostril 1 time per day  B. Azelastine - 1 spray each nostril 2 times per day  3.  Treat and prevent reflux induced inflammation of airway:   A.  Pantoprazole 40 mg - 1 tablet 2 times per day  B.  Famotidine 40 mg - 1 tablet in evening  C.  Decreased caffeine consumption  D.  Decrease chocolate consumption  E.  Replace throat clearing with drinking/swallowing maneuver  4.  If needed:   A. Cetirizine 10 mg - 1 tablet 1 time per day  5.  Return to clinic in 4 weeks or earlier if problem.

## 2023-09-24 NOTE — Progress Notes (Unsigned)
[place] - [place] - [place] - [place] - [place]   Dear Dr. Charline Bills,  Thank you for referring Marquee Fuchs to the [facility] [place] on 09/24/2023.   Below is a summation of this patient's evaluation and recommendations.  Thank you for your referral. I will keep you informed about this patient's response to treatment.   If you have any questions please do not hesitate to contact me.   Sincerely,  Jessica Priest, MD Allergy / Immunology [facility]   ______________________________________________________________________    NEW PATIENT NOTE  Referring Provider: Alver Fisher, MD Primary Provider: Gordan Payment., MD Date of office visit: 09/24/2023    Subjective:   Chief Complaint:  Jesus Mata (DOB: 06/05/1951) is a 72 y.o. male who presents to the clinic on 09/24/2023 with a chief complaint of Sinusitis (Year round sx ) and Nasal Congestion .     HPI: [name] presents to this clinic in evaluation of several distinct issues.  He states that he has had some breathing problems especially at night and he is on oxygen at nighttime and he apparently had a sleep study performed in 2021 that shows sleep apnea but that was never followed up and he has another sleep study scheduled for December 2024 to further evaluate this issue and then he developed a very severe COVID-pneumonia about 2 years ago and he has scarring in his lungs and he seen a heart doctor and a lung doctor regarding this issue.  Overall he thinks he is actually improving regarding his breathing over the course of the past year or so.  But what bothers him and what were brings him to the clinic today is that he has a cough and phlegm production and throat clearing and raspy voice and feeling as though there is something in his throat.  He cannot really clear out his throat.  He might have seen an ear nose and throat  doctor about 1 year ago regarding this issue.  He does have sneezing and some occasional nasal congestion but no anosmia.  He definitely has reflux with regurgitation a few times per week.  He drinks 2 coffees in the morning and a tea at night and he has chocolate 4 times per week and alcohol a few times per week.  He was given an inhaler in the past but he has not used this inhaler in over a year.  Past Medical History:  Diagnosis Date   Anemia    Arthritis    "right shoulder" (06/27/2018)   Dysuria    GERD (gastroesophageal reflux disease)    H. 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 Otherwise normal EGD   EYE SURGERY Left    JOINT REPLACEMENT     KNEE CARTILAGE SURGERY Right    "scope"   TOTAL SHOULDER ARTHROPLASTY Right 06/27/2018   TOTAL SHOULDER ARTHROPLASTY Right 06/27/2018   TOTAL SHOULDER ARTHROPLASTY Right  06/27/2018   Procedure: RIGHT TOTAL SHOULDER ARTHROPLASTY;  Surgeon: Jones Broom, MD;  Location: [facility] OR;  Service: Orthopedics;  Laterality: Right;   UPPER GASTROINTESTINAL ENDOSCOPY      Allergies as of 09/24/2023   No Known Allergies      Medication List     albuterol 108 (90 Base) MCG/ACT inhaler Commonly known as: VENTOLIN HFA Inhale 2 puffs into the lungs every 6 (six) hours as needed for wheezing or shortness of breath.   albuterol (2.5 MG/3ML) 0.083% nebulizer solution Commonly known as: PROVENTIL SMARTSIG:3 Milliliter(s) Via Nebulizer Every 6 Hours PRN   atorvastatin 40 MG tablet Commonly known as:  LIPITOR Take 1 tablet by mouth daily.   famotidine 20 MG tablet Commonly known as: Pepcid Take 1 tablet (20 mg total) by mouth at bedtime.   fluticasone 50 MCG/ACT nasal spray Commonly known as: FLONASE Place into both nostrils daily.   hydrochlorothiazide 25 MG tablet Commonly known as: HYDRODIURIL Take 50 mg by mouth daily.   MULTIVITAMIN ADULT PO Take 1 tablet by mouth daily.   pantoprazole 40 MG tablet Commonly known as: PROTONIX Take 1 tablet (40 mg total) by mouth 2 (two) times daily.    Review of systems negative except as noted in HPI / PMHx or noted below:  Review of Systems  Constitutional: Negative.   HENT: Negative.    Eyes: Negative.   Respiratory: Negative.    Cardiovascular: Negative.   Gastrointestinal: Negative.   Genitourinary: Negative.   Musculoskeletal: Negative.   Skin: Negative.   Neurological: Negative.   Endo/Heme/Allergies: Negative.   Psychiatric/Behavioral: Negative.      Family History  Problem Relation Age of Onset   COPD Brother    Colon cancer Neg Hx    Colon polyps Neg Hx    Esophageal cancer Neg Hx    Stomach cancer Neg Hx    Rectal cancer Neg Hx     Social History   Socioeconomic History   Marital status: Married    Spouse name: Not on file   Number of children: Not on file   Years of education: Not on file   Highest education level: Not on file  Occupational History   Not on file  Tobacco Use   Smoking status: Former    Current packs/day: 0.00    Average packs/day: 1 pack/day for 8.0 years (8.0 ttl pk-yrs)    Types: Cigarettes    Start date: 61    Quit date: 30    Years since quitting: 44.8   Smokeless tobacco: Never  Vaping Use   Vaping status: Never Used  Substance and Sexual Activity   Alcohol use: Yes    Alcohol/week: 7.0 standard drinks of alcohol    Types: 7 Cans of beer per week   Drug use: Never   Sexual activity: Yes  Other Topics Concern   Not on file  Social History Narrative   Not on  file   Environmental and Social history  Lives in a house with a dry environment, dog located inside the household, carpet in the bedroom, no plastic on the bed, no plastic on the pillow, no smoking ongoing with inside the household.  Objective:   Vitals:   09/24/23 0924  BP: 118/68  Pulse: 79  Temp: 98.4 F (36.9 C)  SpO2: 97%   Height: 5' 8.5" (174 cm) Weight: 214 lb (97.1 kg)  Physical Exam Constitutional:      Appearance: He is not diaphoretic.  HENT:  Head: Normocephalic.     Right Ear: Tympanic membrane, ear canal and external ear normal.     Left Ear: Tympanic membrane, ear canal and external ear normal.     Nose: Nose normal. No mucosal edema or rhinorrhea.     Mouth/Throat:     Pharynx: Uvula midline. No oropharyngeal exudate.  Eyes:     Conjunctiva/sclera: Conjunctivae normal.  Neck:     Thyroid: No thyromegaly.     Trachea: Trachea normal. No tracheal tenderness or tracheal deviation.  Cardiovascular:     Rate and Rhythm: Normal rate and regular rhythm.     Heart sounds: Normal heart sounds, S1 normal and S2 normal. No murmur heard. Pulmonary:     Effort: No respiratory distress.     Breath sounds: Normal breath sounds. No stridor. No wheezing or rales.  Lymphadenopathy:     Head:     Right side of head: No tonsillar adenopathy.     Left side of head: No tonsillar adenopathy.     Cervical: No cervical adenopathy.  Skin:    Findings: No erythema or rash.     Nails: There is no clubbing.  Neurological:     Mental Status: He is alert.     Diagnostics: Allergy skin tests were performed.  He demonstrated hypersensitivity against trees, grasses, weeds.  Spirometry was performed and demonstrated an FEV1 of 2.62 @ 89 % of predicted. FEV1/FVC = 0.76   Results of blood tests obtained 04 July 2023 identifies IgE 114 KU/L, antigen specific IgE antibodies directed against grass, tree, weed, creatinine 1.16 Mg/DL, ALT 21H/Y, ALT 86V/H, WBC 10.84, eosinophils  0.3%.  Results of a sinus CT scan obtained 04 July 2023 identifies paranasal sinuses clear.  Results of a chest CT scan obtained 04 July 2023 identifies minimal emphysematous change, slight prominence of interstitial markings in the lung bases and a very faint pattern of inhomogeneous attenuation in the parenchyma at the bases, 3 mm right upper lobe nodule.   Assessment and Plan:    1. Seasonal allergic rhinitis due to pollen   2. LPRD (laryngopharyngeal reflux disease)    1. Allergen avoidance measures - pollen  2.  Treat and prevent inflammation of airway:   A. Flonase - 1 spray each nostril 1 time per day  B. Azelastine - 1 spray each nostril 2 times per day  3.  Treat and prevent reflux induced inflammation of airway:   A.  Pantoprazole 40 mg - 1 tablet 2 times per day  B.  Famotidine 40 mg - 1 tablet in evening  C.  Decreased caffeine consumption  D.  Decrease chocolate consumption  E.  Replace throat clearing with drinking/swallowing maneuver  4.  If needed:   A. Cetirizine 10 mg - 1 tablet 1 time per day  5.  Return to clinic in 4 weeks or earlier if problem.  Syair appears to have inflammation of his airway and there are probably 2 triggers giving rise to this issue.  He has an atopic trigger and he definitely has a reflux trigger.  He will utilize the plan noted above for the next 4 weeks and I will see him back in this clinic at that point in time and make a decision about further evaluation and treatment based upon his response.  Jessica Priest, MD Allergy / Immunology [facility]

## 2023-09-25 ENCOUNTER — Encounter: Payer: Self-pay | Admitting: Allergy and Immunology

## 2023-10-04 NOTE — Addendum Note (Signed)
Addended by: Deborra Medina on: 10/04/2023 12:00 PM   Modules accepted: Orders

## 2023-10-29 ENCOUNTER — Ambulatory Visit (INDEPENDENT_AMBULATORY_CARE_PROVIDER_SITE_OTHER): Payer: No Typology Code available for payment source | Admitting: Allergy and Immunology

## 2023-10-29 ENCOUNTER — Encounter: Payer: Self-pay | Admitting: Allergy and Immunology

## 2023-10-29 DIAGNOSIS — J301 Allergic rhinitis due to pollen: Secondary | ICD-10-CM

## 2023-10-29 DIAGNOSIS — K219 Gastro-esophageal reflux disease without esophagitis: Secondary | ICD-10-CM

## 2023-10-29 NOTE — Patient Instructions (Addendum)
  1. Allergen avoidance measures - pollen  2.  Treat and prevent inflammation of airway:   A. Flonase - 1 spray each nostril 1 time per day  B. Azelastine - 1 spray each nostril 2 times per day  3.  Treat and prevent reflux induced inflammation of airway:   A.  Pantoprazole 40 mg - 1 tablet 2 times per day  B.  Famotidine 40 mg - 1 tablet in evening  C.  Decreased caffeine consumption  D.  Decrease chocolate consumption  E.  Replace throat clearing with drinking/swallowing maneuver  4.  If needed:   A. Cetirizine 10 mg - 1 tablet 1 time per day  5.  [facility]  5.  Return to clinic in 12 weeks or earlier if problem.

## 2023-10-29 NOTE — Progress Notes (Unsigned)
[place] - [place] - [place] - [place] - [place]   Follow-up Note  Referring Provider: Gordan Payment., MD Primary Provider: Gordan Payment., MD Date of Office Visit: 10/29/2023  Subjective:   Jesus Mata (DOB: 1951-04-17) is a 72 y.o. male who [facility]/2023 in re-evaluation of the following:  HPI: Jesus Mata returns to this clinic in evaluation of allergic rhinitis and LPR.  I last saw him in this clinic during his initial evaluation of 24 September 2023.  During his last visit we made several suggestions regarding treatment of his LPR.  He still continues to drink caffeine at pretty much the same level.  He still continues to consume chocolate on a daily basis.  He still continues to throat clear excessively.  He has been using his proton pump inhibitor and H2 receptor blocker.  During his last visit we started him on nasal steroids and antihistamines and that has really worked well and he has no issues with his nose at this point.  Allergies as of 10/29/2023   No Known Allergies      Medication List    atorvastatin 40 MG tablet Commonly known as: LIPITOR Take 1 tablet by mouth daily.   azelastine 0.1 % nasal spray Commonly known as: ASTELIN Place 1 spray into both nostrils 2 (two) times daily as needed.   famotidine 20 MG tablet Commonly known as: Pepcid Take 1 tablet (20 mg total) by mouth at bedtime.   fluticasone 50 MCG/ACT nasal spray Commonly known as: FLONASE Place into both nostrils daily.   hydrochlorothiazide 25 MG tablet Commonly known as: HYDRODIURIL Take 50 mg by mouth daily.   MULTIVITAMIN ADULT PO Take 1 tablet by mouth daily.   pantoprazole 40 MG tablet Commonly known as: PROTONIX Take 1 tablet (40 mg total) by mouth 2 (two) times daily.    Past Medical History:  Diagnosis Date   Anemia    Arthritis    "right shoulder" (06/27/2018)   Dysuria    GERD (gastroesophageal reflux disease)     H. 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 Otherwise normal EGD   EYE SURGERY Left    JOINT REPLACEMENT     KNEE CARTILAGE SURGERY Right    "scope"   TOTAL SHOULDER ARTHROPLASTY Right 06/27/2018   TOTAL SHOULDER ARTHROPLASTY Right 06/27/2018   TOTAL SHOULDER ARTHROPLASTY Right 06/27/2018   Procedure: RIGHT TOTAL SHOULDER ARTHROPLASTY;  Surgeon: Jones Broom, MD;  Location: [facility] OR;  Service: Orthopedics;  Laterality: Right;   UPPER GASTROINTESTINAL ENDOSCOPY      Review of systems negative except as noted in HPI / PMHx or noted below:  Review of Systems  Constitutional: Negative.   HENT: Negative.    Eyes: Negative.   Respiratory: Negative.    Cardiovascular: Negative.   Gastrointestinal: Negative.   Genitourinary: Negative.   Musculoskeletal: Negative.   Skin: Negative.   Neurological: Negative.   Endo/Heme/Allergies: Negative.   Psychiatric/Behavioral: Negative.  Objective:   There were no vitals filed for this visit.        Physical Exam Constitutional:      Appearance: He is not diaphoretic.     Comments: Throat clearing  HENT:     Head: Normocephalic.     Right Ear: External ear normal.     Left Ear: External ear normal.     Ears:     Comments: Bilateral hearing aids    Nose: Nose normal. No mucosal edema or rhinorrhea.     Mouth/Throat:     Pharynx: Uvula midline. No oropharyngeal exudate.  Eyes:     Conjunctiva/sclera: Conjunctivae normal.  Neck:     Thyroid: No thyromegaly.     Trachea: Trachea  normal. No tracheal tenderness or tracheal deviation.  Cardiovascular:     Rate and Rhythm: Normal rate and regular rhythm.     Heart sounds: Normal heart sounds, S1 normal and S2 normal. No murmur heard. Pulmonary:     Effort: No respiratory distress.     Breath sounds: Normal breath sounds. No stridor. No wheezing or rales.  Lymphadenopathy:     Head:     Right side of head: No tonsillar adenopathy.     Left side of head: No tonsillar adenopathy.     Cervical: No cervical adenopathy.  Skin:    Findings: No erythema or rash.     Nails: There is no clubbing.  Neurological:     Mental Status: He is alert.     Diagnostics: none  Assessment and Plan:   1. Seasonal allergic rhinitis due to pollen   2. LPRD (laryngopharyngeal reflux disease)    1. Allergen avoidance measures - pollen  2.  Treat and prevent inflammation of airway:   A. Flonase - 1 spray each nostril 1 time per day  B. Azelastine - 1 spray each nostril 2 times per day  3.  Treat and prevent reflux induced inflammation of airway:   A.  Pantoprazole 40 mg - 1 tablet 2 times per day  B.  Famotidine 40 mg - 1 tablet in evening  C.  Decreased caffeine consumption  D.  Decrease chocolate consumption  E.  Replace throat clearing with drinking/swallowing maneuver  4.  If needed:   A. Cetirizine 10 mg - 1 tablet 1 time per day  5.  [facility]  5.  Return to clinic in 12 weeks or earlier if problem.  Jesus Mata appears to still have irritation of his laryngeal structures and I have emphasized that he needs to stop throat clearing and he needs to consolidate his caffeine and chocolate while he maintains therapy with a proton pump inhibitor and H2 receptor blocker.  We need to take a look at his throat and he will arrange that through [facility] to have [facility] at [facility] perform that exam.  He will continue on anti-inflammatory agents for his airway.  I will see him back in  this clinic in 12 weeks.  Laurette Schimke, MD Allergy / Immunology  [facility]

## 2023-10-30 ENCOUNTER — Encounter: Payer: Self-pay | Admitting: Allergy and Immunology

## 2023-11-28 ENCOUNTER — Ambulatory Visit (HOSPITAL_BASED_OUTPATIENT_CLINIC_OR_DEPARTMENT_OTHER): Payer: No Typology Code available for payment source | Admitting: Internal Medicine

## 2023-11-28 ENCOUNTER — Ambulatory Visit: Payer: No Typology Code available for payment source | Admitting: Internal Medicine

## 2023-11-28 ENCOUNTER — Encounter: Payer: Self-pay | Admitting: Internal Medicine

## 2023-11-28 VITALS — BP 122/80 | HR 72 | Temp 97.8°F | Ht 70.0 in | Wt 211.0 lb

## 2023-11-28 DIAGNOSIS — K2100 Gastro-esophageal reflux disease with esophagitis, without bleeding: Secondary | ICD-10-CM | POA: Diagnosis not present

## 2023-11-28 DIAGNOSIS — R053 Chronic cough: Secondary | ICD-10-CM

## 2023-11-28 DIAGNOSIS — R400 Somnolence: Secondary | ICD-10-CM

## 2023-11-28 DIAGNOSIS — G4733 Obstructive sleep apnea (adult) (pediatric): Secondary | ICD-10-CM | POA: Diagnosis not present

## 2023-11-28 DIAGNOSIS — R0602 Shortness of breath: Secondary | ICD-10-CM

## 2023-11-28 NOTE — Patient Instructions (Signed)
Full PFT Performed Today  

## 2023-11-28 NOTE — Progress Notes (Signed)
Full PFT Performed Today  

## 2023-11-28 NOTE — Patient Instructions (Addendum)
It was a pleasure to see you today!  Please schedule follow up scheduled with myself in 6 months.  If my schedule is not open yet, we will contact you with a reminder closer to that time. Please call [phone] if you haven't heard from [place] a month before, and always call us sooner if issues or concerns arise. You can also send [place] a message through MyChart, but but aware that this is not to be used for urgent issues and it may take up to 5-7 days to receive a reply. Please be aware that you will likely be able to view your results before I have a chance to respond to them. Please give [place] 5 business days to respond to any non-urgent results.    Your breathing testing shows normal pulmonary function. No evidence of COPD.  Your CT Chests from 2023 are reassuring to me.  I think the predominance of your cough and voice issues are related to reflux. I agree that aggressive diet modification would improve this.  Wear nighttime oxygen 1LNC until your get your sleep study and are prescribed CPAP. You cannot become addicted to oxygen.   Follow up with me after your CT scan at the [facility] - please bring a report to your next visit if you can. Hopefully you start feeling better after wearing the CPAP.

## 2023-11-28 NOTE — Progress Notes (Signed)
Abron Haq    [id number]    25-Dec-1950  Primary Care Physician:Grisso, Evlyn Courier., MD Date of Appointment: 11/28/2023 Established Patient Visit  Chief complaint:   Chief Complaint  Patient presents with   Follow-up    PFT done today. Breathing is unchanged. He is having sensation of mucus in his throat, clears throat often.      HPI: Jesus Mata is a 72 y.o. man with dyspnea, passive smoke exposure as well as GERD.   Interval Updates: Here for follow up after PFTs which show normal pulmonary function.   He is supposed to have his sleep study schedule January 2025.   Continues have chronic cough related to reflux. Still drinks caffeine, eats chocolate.  His pcp has ordered a ct chest in July 2025.   No dyspnea that limits ADLs. Wife notes excessive daytime sleepiness and fatigue.   I have reviewed the patient's family social and past medical history and updated as appropriate.   Past Medical History:  Diagnosis Date   Anemia    Arthritis    "right shoulder" (06/27/2018)   Dysuria    GERD (gastroesophageal reflux disease)    H. 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 Otherwise normal EGD   EYE SURGERY Left    JOINT REPLACEMENT     KNEE CARTILAGE SURGERY Right    "scope"   TOTAL SHOULDER ARTHROPLASTY Right 06/27/2018   TOTAL SHOULDER ARTHROPLASTY Right 06/27/2018   TOTAL SHOULDER ARTHROPLASTY Right 06/27/2018   Procedure: RIGHT TOTAL SHOULDER ARTHROPLASTY;  Surgeon: Jones Broom, MD;  Location: [facility] OR;   Service: Orthopedics;  Laterality: Right;   UPPER GASTROINTESTINAL ENDOSCOPY      Family History  Problem Relation Age of Onset   COPD Brother    Colon cancer Neg Hx    Colon polyps Neg Hx    Esophageal cancer Neg Hx    Stomach cancer Neg Hx    Rectal cancer Neg Hx     Social History   Occupational History   Not on file  Tobacco Use   Smoking status: Former    Current packs/day: 0.00    Average packs/day: 1 pack/day for 8.0 years (8.0 ttl pk-yrs)    Types: Cigarettes    Start date: 47    Quit date: 1980    Years since quitting: 44.9   Smokeless tobacco: Never  Vaping Use   Vaping status: Never Used  Substance and Sexual Activity   Alcohol use: Yes    Alcohol/week: 7.0 standard drinks of alcohol    Types: 7 Cans of beer per week   Drug use: Never   Sexual activity: Yes     Physical Exam: Blood pressure 122/80, pulse 72, temperature 97.8 F (36.6 C), temperature source Oral, height 5\' 10"  (1.778 m), weight 211 lb (95.7 kg), SpO2 98%.  Gen:      No acute distress, frequent throat clearing.  ENT:  no nasal polyps, mucus membranes moist,  Lungs:    No increased respiratory effort, symmetric chest wall excursion, clear to auscultation bilaterally, no wheezes or crackles CV:         Regular rate and rhythm; no murmurs, rubs, or gallops.  No pedal edema   Data Reviewed: Imaging: I have personally reviewed the CT Chest from [facility] 2023 - mild bibasilar changes likely atelectasis. No emphysema, no focal consolidaton, few tree in bud opacities consistent with pneumonia. I do not see any fibrotic lung disease.   PFTs: PFTs obtained 11/28/2023 - normal pulmonary function.   Labs:  Immunization status: Immunization History  Administered Date(s) Administered   Fluad Quad(high Dose 65+) 09/13/2020, 09/27/2021   Fluad Trivalent(High Dose 65+) 09/14/2023   Hepatitis B, ADULT 06/24/2015, 08/03/2015, 02/02/2016   Hepatitis B, PED/ADOLESCENT 02/01/2016   Influenza, High  Dose Seasonal PF 09/10/2017, 09/21/2022   Moderna Covid-19 Vaccine Bivalent Booster 68yrs & up 09/27/2021   Moderna Sars-Covid-2 Vaccination 01/23/2020, 02/20/2020, 03/14/2021   Pneumococcal Conjugate-13 08/03/2015   Pneumococcal Polysaccharide-23 01/17/2013, 08/29/2016, 09/19/2018    External Records Personally Reviewed: allergy and asthma  Assessment:  Daytime sleepiness OSA, not yet on CPAP Chronic respiratory failure on 1LNC Nocturnally GERD, not well controlled  Plan/Recommendations:  Your breathing testing shows normal pulmonary function. No evidence of COPD or scarring int he lungs.  Your CT Chests from 2023 are reassuring to me.   I think the predominance of your cough and voice issues are related to reflux. I agree that aggressive diet modification would improve this.   Wear nighttime oxygen 1LNC until your get your sleep study and are prescribed CPAP. You cannot become addicted to oxygen.   Follow up with me after your CT scan at the [facility] - please bring a report to your next visit if you can. Hopefully you start feeling better after wearing the CPAP.   Return to Care: Return in about 6 months (around 05/28/2024).   Durel Salts, MD Pulmonary and Critical Care Medicine [facility] [phone]

## 2023-12-07 LAB — PULMONARY FUNCTION TEST
DL/VA % pred: 107 %
DL/VA: 4.32 ml/min/mmHg/L
DLCO cor % pred: 87 %
DLCO cor: 22.38 ml/min/mmHg
DLCO unc % pred: 86 %
DLCO unc: 21.99 ml/min/mmHg
FEF 25-75 Post: 3.45 L/s
FEF 25-75 Pre: 2.54 L/s
FEF2575-%Change-Post: 36 %
FEF2575-%Pred-Post: 148 %
FEF2575-%Pred-Pre: 109 %
FEV1-%Change-Post: 8 %
FEV1-%Pred-Post: 86 %
FEV1-%Pred-Pre: 79 %
FEV1-Post: 2.71 L
FEV1-Pre: 2.49 L
FEV1FVC-%Change-Post: 1 %
FEV1FVC-%Pred-Pre: 110 %
FEV6-%Change-Post: 7 %
FEV6-%Pred-Post: 81 %
FEV6-%Pred-Pre: 75 %
FEV6-Post: 3.31 L
FEV6-Pre: 3.08 L
FEV6FVC-%Pred-Post: 106 %
FEV6FVC-%Pred-Pre: 106 %
FVC-%Change-Post: 7 %
FVC-%Pred-Post: 76 %
FVC-%Pred-Pre: 71 %
FVC-Post: 3.31 L
FVC-Pre: 3.08 L
Post FEV1/FVC ratio: 82 %
Post FEV6/FVC ratio: 100 %
Pre FEV1/FVC ratio: 81 %
Pre FEV6/FVC Ratio: 100 %
RV % pred: 89 %
RV: 2.26 L
TLC % pred: 79 %
TLC: 5.61 L

## 2023-12-10 ENCOUNTER — Telehealth: Payer: Self-pay | Admitting: Internal Medicine

## 2023-12-10 NOTE — Telephone Encounter (Signed)
error 

## 2024-01-21 ENCOUNTER — Ambulatory Visit (INDEPENDENT_AMBULATORY_CARE_PROVIDER_SITE_OTHER): Payer: No Typology Code available for payment source | Admitting: Allergy and Immunology

## 2024-01-21 ENCOUNTER — Encounter: Payer: Self-pay | Admitting: Allergy and Immunology

## 2024-01-21 VITALS — BP 138/78 | HR 80 | Resp 20

## 2024-01-21 DIAGNOSIS — J301 Allergic rhinitis due to pollen: Secondary | ICD-10-CM

## 2024-01-21 DIAGNOSIS — K219 Gastro-esophageal reflux disease without esophagitis: Secondary | ICD-10-CM

## 2024-01-21 NOTE — Patient Instructions (Addendum)
  1. Allergen avoidance measures - pollen  2.  Treat and prevent inflammation of airway:   A. Flonase - 1 spray each nostril 1 time per day  B. Azelastine  - 1 spray each nostril 2 times per day  3.  Treat and prevent reflux induced inflammation of airway:   A.  Pantoprazole  40 mg - 1 tablet 2 times per day  B.  Famotidine  40 mg - 1 tablet in evening  C.  Decreased caffeine consumption  D.  Decrease chocolate consumption  E.  Replace throat clearing with drinking/swallowing maneuver  4.  If needed:   A. Cetirizine 10 mg - 1 tablet 1 time per day  5.  [facility]  6.  Return to clinic in 6 months or earlier if problem.  7. Influenza = Tamiflu. Covid = Paxlovid

## 2024-01-21 NOTE — Progress Notes (Signed)
[place] - [place] - [place] - [place] - [place]   Follow-up Note  Referring Provider: Gordan Payment., MD Primary Provider: Gordan Payment., MD Date of Office Visit: 01/21/2024  Subjective:   Jesus Mata (DOB: 07-May-1951) is a 73 y.o. male who [facility] in re-evaluation of the following:  HPI: Jesus Mata returns to this clinic in evaluation of allergic rhinitis and LPR.  I last saw him in this clinic 29 October 2023.  He has noticed significant improvement regarding his chronic cough and his throat issues although he still has a little bit of mucus in his throat on occasion while he has been aggressively treating his LPR.  He still drinks a few cups of caffeine throughout the day and still has some chocolate but overall he thinks that he is doing much better while using a proton pump inhibitor and H2 receptor blocker on a consistent basis.  He has not had evaluation of his throat yet at the [place].  He has been undergoing evaluation for sleep apnea and deoxygenation at nighttime.  He tells me that he has "mild" sleep apnea and no therapy was recommended.  He has had very little problems with his nose while using his nasal sprays.  He has not required an antibiotic or systemic steroid for any type of airway issue.  Allergies as of 01/21/2024   No Known Allergies      Medication List    atorvastatin 40 MG tablet Commonly known as: LIPITOR Take 1 tablet by mouth daily.   azelastine 0.1 % nasal spray Commonly known as: ASTELIN Place 1 spray into both nostrils 2 (two) times daily as needed.   ezetimibe 10 MG tablet Commonly known as: ZETIA Take 1 tablet by mouth daily.   famotidine 20 MG tablet Commonly known as: Pepcid Take 1 tablet (20 mg total) by mouth at bedtime.   fluticasone 50 MCG/ACT nasal spray Commonly known as: FLONASE Place into both nostrils daily.   hydrochlorothiazide 25 MG tablet Commonly known as:  HYDRODIURIL Take 50 mg by mouth daily.   MULTIVITAMIN ADULT PO Take 1 tablet by mouth daily.   OXYGEN Inhale into the lungs at bedtime.   pantoprazole 40 MG tablet Commonly known as: PROTONIX Take 1 tablet (40 mg total) by mouth 2 (two) times daily.   sildenafil 20 MG tablet Commonly known as: REVATIO Take by mouth daily as needed.    Past Medical History:  Diagnosis Date   Anemia    Arthritis    "right shoulder" (06/27/2018)   Dysuria    GERD (gastroesophageal reflux disease)    H. 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 Otherwise normal EGD   EYE SURGERY Left    JOINT REPLACEMENT     KNEE CARTILAGE SURGERY Right    "scope"   TOTAL SHOULDER ARTHROPLASTY Right 06/27/2018   TOTAL SHOULDER ARTHROPLASTY Right 06/27/2018   TOTAL SHOULDER ARTHROPLASTY Right 06/27/2018   Procedure: RIGHT TOTAL  SHOULDER ARTHROPLASTY;  Surgeon: Jones Broom, MD;  Location: [facility] OR;  Service: Orthopedics;  Laterality: Right;   UPPER GASTROINTESTINAL ENDOSCOPY      Review of systems negative except as noted in HPI / PMHx or noted below:  Review of Systems  Constitutional: Negative.   HENT: Negative.    Eyes: Negative.   Respiratory: Negative.    Cardiovascular: Negative.   Gastrointestinal: Negative.   Genitourinary: Negative.   Musculoskeletal: Negative.   Skin: Negative.   Neurological: Negative.   Endo/Heme/Allergies: Negative.   Psychiatric/Behavioral: Negative.       Objective:   Vitals:   01/21/24 0937  BP: 138/78  Pulse: 80   Resp: 20  SpO2: 97%          Physical Exam Constitutional:      Appearance: He is not diaphoretic.     Comments: Throat clearing  HENT:     Head: Normocephalic.     Right Ear: Tympanic membrane, ear canal and external ear normal.     Left Ear: Tympanic membrane, ear canal and external ear normal.     Nose: Nose normal. No mucosal edema or rhinorrhea.     Mouth/Throat:     Pharynx: Uvula midline. No oropharyngeal exudate.  Eyes:     Conjunctiva/sclera: Conjunctivae normal.  Neck:     Thyroid: No thyromegaly.     Trachea: Trachea normal. No tracheal tenderness or tracheal deviation.  Cardiovascular:     Rate and Rhythm: Normal rate and regular rhythm.     Heart sounds: Normal heart sounds, S1 normal and S2 normal. No murmur heard. Pulmonary:     Effort: No respiratory distress.     Breath sounds: Normal breath sounds. No stridor. No wheezing or rales.  Lymphadenopathy:     Head:     Right side of head: No tonsillar adenopathy.     Left side of head: No tonsillar adenopathy.     Cervical: No cervical adenopathy.  Skin:    Findings: No erythema or rash.     Nails: There is no clubbing.  Neurological:     Mental Status: He is alert.     Diagnostics: none  Assessment and Plan:   1. Seasonal allergic rhinitis due to pollen   2. LPRD (laryngopharyngeal reflux disease)    1. Allergen avoidance measures - pollen  2.  Treat and prevent inflammation of airway:   A. Flonase - 1 spray each nostril 1 time per day  B. Azelastine - 1 spray each nostril 2 times per day  3.  Treat and prevent reflux induced inflammation of airway:   A.  Pantoprazole 40 mg - 1 tablet 2 times per day  B.  Famotidine 40 mg - 1 tablet in evening  C.  Decreased caffeine consumption  D.  Decrease chocolate consumption  E.  Replace throat clearing with drinking/swallowing maneuver  4.  If needed:   A. Cetirizine 10 mg - 1 tablet 1 time per day  5.  [facility]  6.   Return to clinic in 6 months or earlier if problem.  7. Influenza = Tamiflu. Covid = Paxlovid  Jesus Mata is definitely doing better regarding his respiratory track on his current plan of anti-inflammatory medications for his upper airway and aggressive therapy directed against his LPR.  I would like to have his throat evaluated by ENT to make sure were not dealing with anything else but LPR giving rise to his throat issues.  He  can get that arranged through his primary care doctor at the [place].  I will see him back in this clinic in 6 months or earlier if there is a problem.  Laurette Schimke, MD Allergy / Immunology [facility]

## 2024-01-22 ENCOUNTER — Encounter: Payer: Self-pay | Admitting: Allergy and Immunology

## 2024-07-23 ENCOUNTER — Ambulatory Visit: Payer: No Typology Code available for payment source | Admitting: Allergy and Immunology

## 2024-07-28 ENCOUNTER — Ambulatory Visit (INDEPENDENT_AMBULATORY_CARE_PROVIDER_SITE_OTHER): Admitting: Allergy and Immunology

## 2024-07-28 ENCOUNTER — Other Ambulatory Visit: Payer: Self-pay

## 2024-07-28 ENCOUNTER — Encounter: Payer: Self-pay | Admitting: Allergy and Immunology

## 2024-07-28 VITALS — BP 126/84 | HR 77 | Resp 18 | Ht 70.0 in | Wt 221.2 lb

## 2024-07-28 DIAGNOSIS — J301 Allergic rhinitis due to pollen: Secondary | ICD-10-CM | POA: Diagnosis not present

## 2024-07-28 DIAGNOSIS — K219 Gastro-esophageal reflux disease without esophagitis: Secondary | ICD-10-CM

## 2024-07-28 DIAGNOSIS — J309 Allergic rhinitis, unspecified: Secondary | ICD-10-CM | POA: Insufficient documentation

## 2024-07-28 MED ORDER — PANTOPRAZOLE SODIUM 40 MG PO TBEC: 40.0000 mg | DELAYED_RELEASE_TABLET | Freq: Two times a day (BID) | ORAL | 3 refills | Status: AC

## 2024-07-28 MED ORDER — CETIRIZINE HCL 10 MG PO TABS
10.0000 mg | ORAL_TABLET | Freq: Every day | ORAL | 3 refills | Status: AC
Start: 2024-07-28 — End: ?

## 2024-07-28 MED ORDER — AZELASTINE HCL 0.1 % NA SOLN: 1.0000 | Freq: Two times a day (BID) | NASAL | 3 refills | Status: AC | PRN

## 2024-07-28 MED ORDER — FLUTICASONE PROPIONATE 50 MCG/ACT NA SUSP: 1.0000 | Freq: Every day | NASAL | 5 refills | Status: AC

## 2024-07-28 NOTE — Progress Notes (Unsigned)
 [place] - [place] - [place] - [place] - [place]   Follow-up Note  Referring Provider: Thurmond Cathlyn LABOR., MD Primary Provider: Thurmond Cathlyn LABOR., MD Date of Office Visit: 07/28/2024  Subjective:   Jesus Mata (DOB: Apr 27, 1951) is a 73 y.o. male who [facility] in re-evaluation of the following:  HPI: [name] returns to this clinic in evaluation of allergic rhinitis and LPR.  I last saw him in this clinic 21 January 2024.  He is now using CPAP for sleep apnea.  He has very little problems with his nose and does not need to use any nasal steroids or nasal antihistamines at this point in time.  He is doing very well with his throat while using therapy directed against reflux and being very cognizant about caffeine and chocolate consumption and throat clearing maneuvers.  He informs me that he did have evaluation with ENT and apparently his throat was okay  Allergies as of 07/28/2024   No Known Allergies      Medication List    atorvastatin  40 MG tablet Commonly known as: LIPITOR Take 1 tablet by mouth daily.   azelastine  0.1 % nasal spray Commonly known as: ASTELIN  Place 1 spray into both nostrils 2 (two) times daily as needed.   ezetimibe 10 MG tablet Commonly known as: ZETIA Take 1 tablet by mouth daily.   famotidine  20 MG tablet Commonly known as: Pepcid  Take 1 tablet (20 mg total) by mouth at bedtime.   fluticasone  50 MCG/ACT nasal spray Commonly known as: FLONASE  Place into both nostrils daily.   hydrochlorothiazide  25 MG tablet Commonly known as: HYDRODIURIL  Take 50 mg by mouth daily.   MULTIVITAMIN ADULT PO Take 1 tablet by mouth daily.   OXYGEN Inhale into the lungs at bedtime.   pantoprazole  40 MG tablet Commonly known as: PROTONIX  Take 1 tablet (40 mg total) by mouth 2 (two) times daily.   sildenafil 20 MG tablet Commonly known as: REVATIO Take by mouth daily as needed.    Past Medical  History:  Diagnosis Date   Anemia    Arthritis    right shoulder (06/27/2018)   Dysuria    GERD (gastroesophageal reflux disease)    H. 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 Otherwise normal EGD   EYE SURGERY Left    JOINT REPLACEMENT     KNEE CARTILAGE SURGERY Right    scope   TOTAL SHOULDER ARTHROPLASTY Right 06/27/2018   TOTAL SHOULDER ARTHROPLASTY Right 06/27/2018   TOTAL SHOULDER ARTHROPLASTY Right 06/27/2018   Procedure: RIGHT TOTAL SHOULDER ARTHROPLASTY;  Surgeon: Dozier Soulier, MD;  Location: [facility];  Service: Orthopedics;  Laterality: Right;   UPPER GASTROINTESTINAL ENDOSCOPY      Review of systems negative except as noted in HPI / PMHx or noted below:  Review of Systems  Constitutional: Negative.   HENT: Negative.    Eyes: Negative.   Respiratory: Negative.    Cardiovascular: Negative.   Gastrointestinal:  Negative.   Genitourinary: Negative.   Musculoskeletal: Negative.   Skin: Negative.   Neurological: Negative.   Endo/Heme/Allergies: Negative.   Psychiatric/Behavioral: Negative.       Objective:   Vitals:   07/28/24 0835  BP: 126/84  Pulse: 77  Resp: 18  SpO2: 96%   Height: 5' 10 (177.8 cm)  Weight: 221 lb 3.2 oz (100.3 kg)   Physical Exam Constitutional:      Appearance: He is not diaphoretic.  HENT:     Head: Normocephalic.     Right Ear: Tympanic membrane, ear canal and external ear normal.     Left Ear: Tympanic membrane, ear canal and external ear normal.     Nose: Nose normal. No mucosal  edema or rhinorrhea.     Mouth/Throat:     Pharynx: Uvula midline. No oropharyngeal exudate.  Eyes:     Conjunctiva/sclera: Conjunctivae normal.  Neck:     Thyroid : No thyromegaly.     Trachea: Trachea normal. No tracheal tenderness or tracheal deviation.  Cardiovascular:     Rate and Rhythm: Normal rate and regular rhythm.     Heart sounds: Normal heart sounds, S1 normal and S2 normal. No murmur heard. Pulmonary:     Effort: No respiratory distress.     Breath sounds: Normal breath sounds. No stridor. No wheezing or rales.  Lymphadenopathy:     Head:     Right side of head: No tonsillar adenopathy.     Left side of head: No tonsillar adenopathy.     Cervical: No cervical adenopathy.  Skin:    Findings: No erythema or rash.     Nails: There is no clubbing.  Neurological:     Mental Status: He is alert.     Diagnostics: none  Assessment and Plan:   1. Seasonal allergic rhinitis due to pollen   2. LPRD (laryngopharyngeal reflux disease)    1. Allergen avoidance measures - pollen  2.  Treat and prevent reflux induced inflammation of airway:   A.  Pantoprazole  40 mg - 1 tablet 2 times per day  B.  Decrease caffeine consumption  C.  Decrease chocolate consumption  D.  Replace throat clearing with drinking/swallowing maneuver  3.  If needed:   A. Cetirizine  10 mg - 1 tablet 1 time per day  B. Flonase  - 1 spray each nostril 1 time per day  C. Azelastine  - 1 spray each nostril 2 times per day  4.  Return to clinic in 12 months or earlier if problem.  5. Influenza = Tamiflu. Covid = Paxlovid  K[place] appears to be doing very well at this point in time on his current plan which includes the use of therapy directed against LPR and some occasional medications directed against his atopic disease.  Assuming he does well with the plan noted above we will see him back in his clinic in 1 year or earlier if there is a problem.  Camellia Denis, MD Allergy  / Immunology [facility]

## 2024-07-28 NOTE — Patient Instructions (Signed)
  1. Allergen avoidance measures - pollen  2.  Treat and prevent reflux induced inflammation of airway:   A.  Pantoprazole  40 mg - 1 tablet 2 times per day  B.  Decrease caffeine consumption  C.  Decrease chocolate consumption  D.  Replace throat clearing with drinking/swallowing maneuver  3.  If needed:   A. Cetirizine  10 mg - 1 tablet 1 time per day  B. Flonase  - 1 spray each nostril 1 time per day  C. Azelastine  - 1 spray each nostril 2 times per day  4.  Return to clinic in 12 months or earlier if problem.  5. Influenza = Tamiflu. Covid = Paxlovid

## 2024-07-29 ENCOUNTER — Encounter: Payer: Self-pay | Admitting: Allergy and Immunology

## 2024-10-23 ENCOUNTER — Encounter: Payer: Self-pay | Admitting: Internal Medicine

## 2024-10-23 ENCOUNTER — Ambulatory Visit: Admitting: Internal Medicine

## 2024-11-16 ENCOUNTER — Inpatient Hospital Stay (HOSPITAL_BASED_OUTPATIENT_CLINIC_OR_DEPARTMENT_OTHER): Admission: RE | Admit: 2024-11-16 | Discharge: 2024-11-16 | Attending: Family Medicine

## 2024-11-16 ENCOUNTER — Encounter (HOSPITAL_BASED_OUTPATIENT_CLINIC_OR_DEPARTMENT_OTHER): Payer: Self-pay

## 2024-11-16 VITALS — BP 122/76 | HR 88 | Temp 98.1°F | Resp 20

## 2024-11-16 DIAGNOSIS — R051 Acute cough: Secondary | ICD-10-CM

## 2024-11-16 LAB — POC COVID19/FLU A&B COMBO
Covid Antigen, POC: NEGATIVE
Influenza A Antigen, POC: NEGATIVE
Influenza B Antigen, POC: NEGATIVE

## 2024-11-16 MED ORDER — PROMETHAZINE-DM 6.25-15 MG/5ML PO SYRP: 5.0000 mL | ORAL_SOLUTION | Freq: Four times a day (QID) | ORAL | 0 refills | Status: AC | PRN

## 2024-11-16 MED ORDER — AZITHROMYCIN 250 MG PO TABS: 250.0000 mg | ORAL_TABLET | Freq: Every day | ORAL | 0 refills | Status: AC

## 2024-11-16 NOTE — ED Triage Notes (Signed)
 Pt c/o cough, nasal congestion, and body aches for the last 5 days. He has taken a OTC cough syrup with no relief.

## 2024-11-16 NOTE — ED Provider Notes (Signed)
 [facility]    CSN: [id number] Arrival date & time: 11/16/24  1317      History   Chief Complaint Chief Complaint  Patient presents with   Cough    Congestion to chest that has been getting worse starting 3 days ago.   History of bad pneumonia. - Entered by patient    HPI Jesus Mata is a 73 y.o. male.   Pt c/o cough, nasal congestion, and body aches for the last 5 days. He has taken a OTC cough syrup with no relief.     Cough   Past Medical History:  Diagnosis Date   Anemia    Arthritis    right shoulder (06/27/2018)   Dysuria    GERD (gastroesophageal reflux disease)    H. 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 Otherwise normal EGD   EYE SURGERY Left    JOINT REPLACEMENT     KNEE CARTILAGE SURGERY Right    scope   TOTAL SHOULDER ARTHROPLASTY Right 06/27/2018   TOTAL SHOULDER ARTHROPLASTY Right 06/27/2018   TOTAL SHOULDER ARTHROPLASTY Right 06/27/2018   Procedure: RIGHT TOTAL SHOULDER ARTHROPLASTY;  Surgeon: Dozier Soulier, MD;  Location: [facility] OR;  Service: Orthopedics;  Laterality: Right;   UPPER GASTROINTESTINAL ENDOSCOPY         Home Medications    Prior to Admission medications   Medication Sig Start Date End Date Taking? Authorizing Provider  azithromycin  (ZITHROMAX ) 250 MG tablet Take 1 tablet (250 mg total) by mouth daily. Take first 2  tablets together, then 1 every day until finished. 11/16/24  Yes Emmali Karow A, FNP  promethazine -dextromethorphan (PROMETHAZINE -DM) 6.25-15 MG/5ML syrup Take 5 mLs by mouth 4 (four) times daily as needed for cough. 11/16/24  Yes Johntae Broxterman A, FNP  atorvastatin  (LIPITOR) 40 MG tablet Take 1 tablet by mouth daily. 03/20/22   [provider]  azelastine  (ASTELIN ) 0.1 % nasal spray Place 1 spray into both nostrils 2 (two) times daily as needed for rhinitis or allergies. 07/28/24   Kozlow, Camellia PARAS, MD  cetirizine  (ZYRTEC ) 10 MG tablet Take 1 tablet (10 mg total) by mouth daily. 07/28/24   Kozlow, Eric J, MD  ezetimibe (ZETIA) 10 MG tablet Take 1 tablet by mouth daily. 10/17/23   [provider]  famotidine  (PEPCID ) 20 MG tablet Take 1 tablet (20 mg total) by mouth at bedtime. 05/29/23   Charlanne Groom, MD  fluticasone  (FLONASE ) 50 MCG/ACT nasal spray Place 1 spray into both nostrils daily. 07/28/24   Kozlow, Camellia PARAS, MD  hydrochlorothiazide  (HYDRODIURIL ) 25 MG tablet Take 50 mg by mouth daily. 09/15/23   [provider]  Multiple Vitamins-Minerals (MULTIVITAMIN ADULT PO) Take 1 tablet by mouth daily.    [provider]  OXYGEN Inhale into the lungs at bedtime.    [provider]  pantoprazole  (PROTONIX )  40 MG tablet Take 1 tablet (40 mg total) by mouth 2 (two) times daily. 07/28/24   Kozlow, Eric J, MD  sildenafil (REVATIO) 20 MG tablet Take by mouth daily as needed. 11/22/23   [provider]    Family History Family History  Problem Relation Age of Onset   COPD Brother    Colon cancer Neg Hx    Colon polyps Neg Hx    Esophageal cancer Neg Hx    Stomach cancer Neg Hx    Rectal cancer Neg Hx     Social History Social History   Tobacco Use   Smoking status: Former    Current packs/day: 0.00    Average packs/day: 1 pack/day for 8.0 years (8.0 ttl pk-yrs)    Types: Cigarettes    Start date: 86    Quit date: 1980    Years since quitting: 45.9    Smokeless tobacco: Never  Vaping Use   Vaping status: Never Used  Substance Use Topics   Alcohol use: Yes    Alcohol/week: 7.0 standard drinks of alcohol    Types: 7 Cans of beer per week   Drug use: Never     Allergies   Patient has no known allergies.   Review of Systems Review of Systems  Respiratory:  Positive for cough.      Physical Exam Triage Vital Signs ED Triage Vitals  Encounter Vitals Group     BP 11/16/24 1330 122/76     Girls Systolic BP Percentile --      Girls Diastolic BP Percentile --      Boys Systolic BP Percentile --      Boys Diastolic BP Percentile --      Pulse Rate 11/16/24 1330 88     Resp 11/16/24 1330 20     Temp 11/16/24 1330 98.1 F (36.7 C)     Temp Source 11/16/24 1330 Oral     SpO2 11/16/24 1330 95 %     Weight --      Height --      Head Circumference --      Peak Flow --      Pain Score 11/16/24 1328 8     Pain Loc --      Pain Education --      Exclude from Growth Chart --    No data found.  Updated Vital Signs BP 122/76 (BP Location: Right Arm)   Pulse 88   Temp 98.1 F (36.7 C) (Oral)   Resp 20   SpO2 95%   Visual Acuity Right Eye Distance:   Left Eye Distance:   Bilateral Distance:    Right Eye Near:   Left Eye Near:    Bilateral Near:     Physical Exam   UC Treatments / Results  Labs (all labs ordered are listed, but only abnormal results are displayed) Labs Reviewed  POC COVID19/FLU A&B COMBO - Normal    EKG   Radiology No results found.  Procedures Procedures (including critical care time)  Medications Ordered in UC Medications - No data to display  Initial Impression / Assessment and Plan / UC Course  I have reviewed the triage vital signs and the nursing notes.  Pertinent labs & imaging results that were available during my care of the patient were reviewed by me and considered in my medical decision making (see chart for details).     *** Final Clinical Impressions(s) / UC  Diagnoses   Final diagnoses:  Acute cough     Discharge Instructions      Covid and flu testing negative. Treating you for an upper respiratory infection. Take the antibiotics as prescribed. Cough medication as needed Rest, Hydrate.    ED Prescriptions     Medication Sig Dispense Auth. Provider   azithromycin  (ZITHROMAX ) 250 MG tablet Take 1 tablet (250 mg total) by mouth daily. Take first 2 tablets together, then 1 every day until finished. 6 tablet Onnie Alatorre A, FNP   promethazine -dextromethorphan (PROMETHAZINE -DM) 6.25-15 MG/5ML syrup Take 5 mLs by mouth 4 (four) times daily as needed for cough. 118 mL Adah Corning A, FNP      PDMP not reviewed this encounter.

## 2024-11-16 NOTE — Discharge Instructions (Signed)
 Covid and flu testing negative. Treating you for an upper respiratory infection. Take the antibiotics as prescribed. Cough medication as needed Rest, Hydrate.

## 2025-07-27 ENCOUNTER — Ambulatory Visit: Admitting: Allergy and Immunology
